# Patient Record
Sex: Female | Born: 1960 | Race: White | Hispanic: No | Marital: Married | Smoking: Never smoker
Health system: Southern US, Community
[De-identification: ages and names within clinical notes are randomized; demographics above are authoritative.]

## PROBLEM LIST (undated history)

## (undated) DIAGNOSIS — M810 Age-related osteoporosis without current pathological fracture: Secondary | ICD-10-CM

## (undated) DIAGNOSIS — T83718A Erosion of other implanted mesh and other prosthetic materials to surrounding organ or tissue, initial encounter: Secondary | ICD-10-CM

## (undated) DIAGNOSIS — Z8719 Personal history of other diseases of the digestive system: Secondary | ICD-10-CM

## (undated) DIAGNOSIS — G40109 Localization-related (focal) (partial) symptomatic epilepsy and epileptic syndromes with simple partial seizures, not intractable, without status epilepticus: Secondary | ICD-10-CM

## (undated) HISTORY — DX: Erosion of other implanted mesh to organ or tissue, initial encounter: T83.718A

## (undated) HISTORY — DX: Personal history of other diseases of the digestive system: Z87.19

## (undated) HISTORY — PX: COLONOSCOPY: SHX174

## (undated) HISTORY — DX: Age-related osteoporosis without current pathological fracture: M81.0

## (undated) HISTORY — PX: DENTAL SURGERY: SHX609

## (undated) HISTORY — DX: Localization-related (focal) (partial) symptomatic epilepsy and epileptic syndromes with simple partial seizures, not intractable, without status epilepticus: G40.109

## (undated) HISTORY — PX: APPENDECTOMY: SHX54

---

## 2003-12-17 HISTORY — PX: PARTIAL HYSTERECTOMY: SHX80

## 2014-07-07 HISTORY — PX: OTHER SURGICAL HISTORY: SHX169

## 2017-09-19 DIAGNOSIS — N8110 Cystocele, unspecified: Secondary | ICD-10-CM | POA: Insufficient documentation

## 2017-10-31 DIAGNOSIS — G40909 Epilepsy, unspecified, not intractable, without status epilepticus: Secondary | ICD-10-CM | POA: Insufficient documentation

## 2018-05-21 DIAGNOSIS — I6521 Occlusion and stenosis of right carotid artery: Secondary | ICD-10-CM | POA: Insufficient documentation

## 2019-10-12 DIAGNOSIS — E559 Vitamin D deficiency, unspecified: Secondary | ICD-10-CM | POA: Insufficient documentation

## 2021-05-08 ENCOUNTER — Other Ambulatory Visit: Payer: Self-pay

## 2021-05-08 ENCOUNTER — Encounter: Payer: Self-pay | Admitting: Neurology

## 2021-05-08 ENCOUNTER — Ambulatory Visit: Payer: 59 | Admitting: Neurology

## 2021-05-08 VITALS — BP 118/84 | HR 74 | Ht 66.0 in | Wt 156.8 lb

## 2021-05-08 DIAGNOSIS — G40209 Localization-related (focal) (partial) symptomatic epilepsy and epileptic syndromes with complex partial seizures, not intractable, without status epilepticus: Secondary | ICD-10-CM | POA: Diagnosis not present

## 2021-05-08 MED ORDER — ZONEGRAN 100 MG PO CAPS: ORAL_CAPSULE | ORAL | 11 refills | Status: DC

## 2021-05-08 MED ORDER — VIMPAT 200 MG PO TABS: 200.0000 mg | ORAL_TABLET | Freq: Two times a day (BID) | ORAL | 5 refills | Status: DC

## 2021-05-08 NOTE — Patient Instructions (Addendum)
1. Schedule MRI brain with and without contrast  2. We will switch to brand Zonegran 100mg : Take 2 capsules twice a day and brand Vimpat 200mg : 1 tablet twice a day  3. Wean off clobazam 1/2 every other day for a week then stop  4. Follow-up in 3 months, call for any changes   Seizure Precautions: 1. If medication has been prescribed for you to prevent seizures, take it exactly as directed.  Do not stop taking the medicine without talking to your doctor first, even if you have not had a seizure in a long time.   2. Avoid activities in which a seizure would cause danger to yourself or to others.  Don't operate dangerous machinery, swim alone, or climb in high or dangerous places, such as on ladders, roofs, or girders.  Do not drive unless your doctor says you may.  3. If you have any warning that you may have a seizure, lay down in a safe place where you can't hurt yourself.    4.  No driving for 6 months from last seizure, as per [facility].   Please refer to the following link on the Epilepsy Foundation of America's website for more information: http://www.epilepsyfoundation.org/answerplace/Social/driving/drivingu.cfm   5.  Maintain good sleep hygiene. Avoid alcohol  6.  Contact your doctor if you have any problems that may be related to the medicine you are taking.  7.  Call 911 and bring the patient back to the ED if:        A.  The seizure lasts longer than 5 minutes.       B.  The patient doesn't awaken shortly after the seizure  C.  The patient has new problems such as difficulty seeing, speaking or moving  D.  The patient was injured during the seizure  E.  The patient has a temperature over 102 F (39C)  F.  The patient vomited and now is having trouble breathing
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
## 2021-05-09 NOTE — Progress Notes (Unsigned)
Kaprice Kage (Key: [id number]) Rx #: [id number] Vimpat 200MG  tablets   Form [facility] Electronic Prior Authorization Form (2017 NCPDP) Created 20 hours ago Sent to Plan 16 minutes ago Plan Response 16 minutes ago Submit Clinical Questions less than a minute ago Determination Wait for Determination Please wait for [facility] 2017 NCPDP to return a determination.

## 2021-05-11 ENCOUNTER — Telehealth: Payer: Self-pay | Admitting: Neurology

## 2021-05-11 NOTE — Telephone Encounter (Signed)
We will start a PA in cover my meds, will call a pt to let her know

## 2021-05-11 NOTE — Telephone Encounter (Signed)
Patient called and said she is having problems with her insurance covering her Vimpat and Zonegran. She said they both require prior authorization.

## 2021-05-15 NOTE — Progress Notes (Signed)
This request has been approved. °

## 2021-05-15 NOTE — Telephone Encounter (Signed)
This was started awaiting answer,

## 2021-05-16 ENCOUNTER — Telehealth: Payer: Self-pay

## 2021-05-16 NOTE — Telephone Encounter (Signed)
Pt called an informed that samples of vimpat 100mg  are at the front for her to pick up and that she will need to take 2 tablets BID to equal 200mg   BID, pt verbalized understanding,

## 2021-05-16 NOTE — Telephone Encounter (Signed)
Medication Samples have been provided to the patient.  Drug name: vimpat     Strength: 100 mg        Qty: 4 box  LOT: [id number]  Exp.Date: 01/2025  Dosing instructions: take 2 tablets BID  The patient has been instructed regarding the correct time, dose, and frequency of taking this medication, including desired effects and most common side effects.   11:35 AM 05/16/2021

## 2021-05-21 ENCOUNTER — Encounter: Payer: Self-pay | Admitting: Neurology

## 2021-05-28 ENCOUNTER — Telehealth: Payer: Self-pay

## 2021-05-28 NOTE — Telephone Encounter (Signed)
Medication Samples have been provided to the patient.  Drug name: vimpat       Strength: 150 mg        Qty: 4  LOT: [id number]  Exp.Date: 09/2023  Dosing instructions: take 150mg  with 50mg  BID  Drug name: vimpat       Strength: 50 mg        Qty: 4  LOT:  Exp.Date: 12/2024  Dosing instructions: take 50mg  with 150mg  BID  The patient has been instructed regarding the correct time, dose, and frequency of taking this medication, including desired effects and most common side effects.

## 2021-05-31 ENCOUNTER — Other Ambulatory Visit: Payer: 59

## 2021-06-01 ENCOUNTER — Other Ambulatory Visit: Payer: Self-pay

## 2021-06-01 MED ORDER — VIMPAT 200 MG PO TABS: 200.0000 mg | ORAL_TABLET | Freq: Two times a day (BID) | ORAL | 5 refills | Status: DC

## 2021-06-12 ENCOUNTER — Ambulatory Visit
Admission: RE | Admit: 2021-06-12 | Discharge: 2021-06-12 | Disposition: A | Discharge status: Home / Self Care | Payer: 59 | Source: Ambulatory Visit | Attending: Neurology | Admitting: Neurology

## 2021-06-12 ENCOUNTER — Other Ambulatory Visit: Payer: Self-pay

## 2021-06-12 DIAGNOSIS — G40209 Localization-related (focal) (partial) symptomatic epilepsy and epileptic syndromes with complex partial seizures, not intractable, without status epilepticus: Secondary | ICD-10-CM

## 2021-06-12 MED ORDER — GADOBENATE DIMEGLUMINE 529 MG/ML IV SOLN
14.0000 mL | Freq: Once | INTRAVENOUS | Status: AC | PRN
  Administered 2021-06-12: 14 mL via INTRAVENOUS

## 2021-06-14 ENCOUNTER — Telehealth: Payer: Self-pay

## 2021-06-14 DIAGNOSIS — G40209 Localization-related (focal) (partial) symptomatic epilepsy and epileptic syndromes with complex partial seizures, not intractable, without status epilepticus: Secondary | ICD-10-CM

## 2021-06-14 NOTE — Telephone Encounter (Signed)
Pt called and informed that brain MRI was read as normal, no evidence of tumor, stroke, bleed, or scartissue. I would like to repeat her EEG so we have a baseline here. Order placed in Epic

## 2021-06-14 NOTE — Telephone Encounter (Signed)
-----   Message from Van Clines, MD sent at 06/14/2021  9:46 AM EDT ----- Pls let Ms Mentel know that the brain MRI was read as normal, no evidence of tumor, stroke, bleed, or scar tissue. I would like to repeat her EEG so we have a baseline here, pls order 1-hour EEG, thanks!

## 2021-06-26 ENCOUNTER — Other Ambulatory Visit: Payer: Self-pay

## 2021-06-26 MED ORDER — VIMPAT 200 MG PO TABS: 200.0000 mg | ORAL_TABLET | Freq: Two times a day (BID) | ORAL | 5 refills | Status: DC

## 2021-06-27 ENCOUNTER — Ambulatory Visit: Payer: 59 | Admitting: Neurology

## 2021-06-27 ENCOUNTER — Other Ambulatory Visit: Payer: Self-pay

## 2021-06-27 DIAGNOSIS — G40209 Localization-related (focal) (partial) symptomatic epilepsy and epileptic syndromes with complex partial seizures, not intractable, without status epilepticus: Secondary | ICD-10-CM | POA: Diagnosis not present

## 2021-07-02 NOTE — Progress Notes (Unsigned)
preferred on your patient's plan. Macky Lower (Key: [id number]) Vimpat 200MG  tablets   Form [facility] Electronic Prior Authorization Form (2017 NCPDP) Created 6 days ago Sent to Plan 5 minutes ago Plan Response 5 minutes ago Submit Clinical Questions less than a minute ago Determination Wait for Determination Please wait for [facility] 2017 NCPDP to return a determination.

## 2021-07-05 NOTE — Procedures (Signed)
ELECTROENCEPHALOGRAM REPORT  Date of Study: 06/27/2021  Patient's Name: Deborah Lynn MRN: [id number] Date of Birth: 01/20/1961  Referring Provider: Dr. Patrcia Dolly  Clinical History:  This is a 60 year old woman with left temporal lobe epilepsy s/p VNS placement with continued seizures on 3 ASMs.   Medications: ONFI 10 MG tablet VIMPAT 200 MG TABS tablet ZONEGRAN 100 MG capsule  Technical Summary: A multichannel digital 1-hour EEG recording measured by the international 10-20 system with electrodes applied with paste and impedances below 5000 ohms performed in our laboratory with EKG monitoring in an awake and asleep patient.  Hyperventilation was not performed. Photic stimulation was performed.  The digital EEG was referentially recorded, reformatted, and digitally filtered in a variety of bipolar and referential montages for optimal display.    Description: The patient is awake and asleep during the recording.  During maximal wakefulness, there is a symmetric, medium voltage 9.5-10 Hz posterior dominant rhythm that attenuates with eye opening.  There is frequent focal theta and delta slowing over the left temporal region. During drowsiness and sleep, there is an increase in theta slowing of the background.  Vertex waves and symmetric sleep spindles were seen. Photic stimulation did not elicit any abnormalities.  There were occasional sharp waves over the left frontotemporal region. No electrographic seizures seen.    EKG lead was unremarkable.  Impression: This awake and asleep EEG is abnormal due to the presence of: Focal slowing over the left temporal region Occasional epileptiform discharges over the left frontotemporal region  Clinical Correlation of the above findings indicates focal cerebral dysfunction over the left temporal region suggestive of underlying structural or physiologic abnormality. There is a tendency for seizures to arise from the left frontotemporal region.  Clinical correlation is advised.   Patrcia Dolly, M.D.

## 2021-08-03 ENCOUNTER — Other Ambulatory Visit: Payer: Self-pay

## 2021-08-03 MED ORDER — ZONEGRAN 100 MG PO CAPS: ORAL_CAPSULE | ORAL | 0 refills | Status: DC

## 2021-08-06 ENCOUNTER — Ambulatory Visit: Payer: Self-pay | Admitting: Neurology

## 2021-08-06 ENCOUNTER — Telehealth: Payer: Self-pay

## 2021-08-06 ENCOUNTER — Other Ambulatory Visit: Payer: Self-pay | Admitting: Neurology

## 2021-08-06 MED ORDER — ZONISAMIDE 100 MG PO CAPS: ORAL_CAPSULE | ORAL | 1 refills | Status: DC

## 2021-08-06 NOTE — Telephone Encounter (Signed)
New message    Pending Key: [id number] - PA Case ID: [id number] - Rx #: [id number]   Need help? Call us at [phone]  Status Sent to Plan today  Drug Zonegran 100MG  capsules  Form [facility] Electronic Prior Authorization Form (2017 NCPDP)

## 2021-08-07 NOTE — Telephone Encounter (Signed)
F/u   Approved on 08/06/2021 through 08/06/2022   Key: [id number] - PA Case ID: [id number] - Rx #: [id number]  Request Reference Number: [id number]. ZONEGRAN CAP 100MG  is approved through 08/06/2022. Your patient may now fill this prescription and it will be covered.

## 2021-09-06 ENCOUNTER — Other Ambulatory Visit: Payer: Self-pay

## 2021-09-06 ENCOUNTER — Encounter: Payer: Self-pay | Admitting: Neurology

## 2021-09-06 ENCOUNTER — Ambulatory Visit: Payer: 59 | Admitting: Neurology

## 2021-09-06 VITALS — BP 104/72 | HR 73 | Ht 66.0 in | Wt 160.0 lb

## 2021-09-06 DIAGNOSIS — G40209 Localization-related (focal) (partial) symptomatic epilepsy and epileptic syndromes with complex partial seizures, not intractable, without status epilepticus: Secondary | ICD-10-CM

## 2021-09-06 NOTE — Patient Instructions (Signed)
Good to see you.  Continue Vimpat 200mg  twice a day.  2. Once close to a week for your Zonegran refill, send me a message so I can send in the prescription for Zonegran 200mg  twice a day  3. Keep seizure diary  4. Follow-up in 4 months, call for any changes   Seizure Precautions: 1. If medication has been prescribed for you to prevent seizures, take it exactly as directed.  Do not stop taking the medicine without talking to your doctor first, even if you have not had a seizure in a long time.   2. Avoid activities in which a seizure would cause danger to yourself or to others.  Don't operate dangerous machinery, swim alone, or climb in high or dangerous places, such as on ladders, roofs, or girders.  Do not drive unless your doctor says you may.  3. If you have any warning that you may have a seizure, lay down in a safe place where you can't hurt yourself.    4.  No driving for 6 months from last seizure, as per [facility].   Please refer to the following link on the Epilepsy Foundation of America's website for more information: http://www.epilepsyfoundation.org/answerplace/Social/driving/drivingu.cfm   5.  Maintain good sleep hygiene. Avoid alcohol.  6.  Contact your doctor if you have any problems that may be related to the medicine you are taking.  7.  Call 911 and bring the patient back to the ED if:        A.  The seizure lasts longer than 5 minutes.       B.  The patient doesn't awaken shortly after the seizure  C.  The patient has new problems such as difficulty seeing, speaking or moving  D.  The patient was injured during the seizure  E.  The patient has a temperature over 102 F (39C)  F.  The patient vomited and now is having trouble breathing
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
## 2021-09-28 ENCOUNTER — Telehealth: Payer: Self-pay

## 2021-09-28 NOTE — Telephone Encounter (Signed)
New message  [name] Key: [id number] - [place] Case ID: [id number] Need help? Call us at [phone]  Outcome N/Atoday  This case has been cancelled by the provider because the prescriber will initiate a new request for Generic equivalent.  Drug Zonegran 100MG  capsules Form [employer] Electronic Prior Authorization Form (2017 NCPDP)    This request has received a N/A outcome.  Please note any additional information provided by [facility] at the bottom of this request.

## 2021-10-02 NOTE — Telephone Encounter (Signed)
F/u   Renewal submit via CoverMyMeds  Deborah Lynn Key: [id number] - [place] Case ID: [id number] Need help? Call us at [phone] Status Sent to Plan today Drug Zonegran 100MG  capsules Form [employer] Electronic Prior Authorization Form (2017 NCPDP)

## 2021-10-08 NOTE — Telephone Encounter (Signed)
F/u   Received fax from [facility]    This medication or product was previously approved on [place] -[id number] from 08/06/2021  to 08/06/2022

## 2021-10-15 ENCOUNTER — Telehealth: Payer: Self-pay | Admitting: Neurology

## 2021-10-15 NOTE — Telephone Encounter (Signed)
Marylene Land from [facility] called in to see if we have received a letter from them with some questions. It had been faxed in last week. They received a report about the patient's VNS and they need those questions answered and faxed back to them.

## 2021-10-23 NOTE — Telephone Encounter (Signed)
Done, thanks

## 2021-10-31 ENCOUNTER — Other Ambulatory Visit: Payer: Self-pay

## 2021-11-01 MED ORDER — ZONISAMIDE 100 MG PO CAPS: ORAL_CAPSULE | ORAL | 3 refills | Status: DC

## 2021-11-29 ENCOUNTER — Other Ambulatory Visit: Payer: Self-pay | Admitting: Neurology

## 2021-12-04 ENCOUNTER — Other Ambulatory Visit: Payer: Self-pay | Admitting: Neurology

## 2021-12-04 MED ORDER — LACOSAMIDE 200 MG PO TABS: ORAL_TABLET | ORAL | 5 refills | Status: DC

## 2021-12-06 ENCOUNTER — Encounter: Payer: Self-pay | Admitting: Neurology

## 2021-12-07 ENCOUNTER — Encounter: Payer: Self-pay | Admitting: Neurology

## 2021-12-07 ENCOUNTER — Telehealth: Payer: 59 | Admitting: Neurology

## 2021-12-07 ENCOUNTER — Other Ambulatory Visit: Payer: Self-pay

## 2021-12-07 VITALS — Ht 66.0 in | Wt 160.0 lb

## 2021-12-07 DIAGNOSIS — G40209 Localization-related (focal) (partial) symptomatic epilepsy and epileptic syndromes with complex partial seizures, not intractable, without status epilepticus: Secondary | ICD-10-CM | POA: Diagnosis not present

## 2021-12-07 MED ORDER — ZONISAMIDE 100 MG PO CAPS: ORAL_CAPSULE | ORAL | 3 refills | Status: DC

## 2021-12-07 MED ORDER — FLUOXETINE HCL 10 MG PO CAPS: 10.0000 mg | ORAL_CAPSULE | Freq: Every day | ORAL | 6 refills | Status: DC

## 2021-12-07 MED ORDER — LACOSAMIDE 200 MG PO TABS: ORAL_TABLET | ORAL | 5 refills | Status: DC
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
## 2021-12-24 ENCOUNTER — Other Ambulatory Visit: Payer: Self-pay | Admitting: Neurology

## 2022-01-16 ENCOUNTER — Encounter: Payer: Self-pay | Admitting: Neurology

## 2022-01-18 ENCOUNTER — Ambulatory Visit: Payer: 59 | Admitting: Neurology

## 2022-03-08 ENCOUNTER — Ambulatory Visit: Payer: 59 | Admitting: Neurology

## 2022-03-08 ENCOUNTER — Encounter: Payer: Self-pay | Admitting: Neurology

## 2022-03-08 ENCOUNTER — Other Ambulatory Visit: Payer: Self-pay

## 2022-03-08 VITALS — BP 117/75 | HR 71 | Resp 18 | Ht 66.0 in | Wt 164.0 lb

## 2022-03-08 DIAGNOSIS — G40209 Localization-related (focal) (partial) symptomatic epilepsy and epileptic syndromes with complex partial seizures, not intractable, without status epilepticus: Secondary | ICD-10-CM

## 2022-03-08 MED ORDER — VIMPAT 200 MG PO TABS: 200.0000 mg | ORAL_TABLET | Freq: Two times a day (BID) | ORAL | 5 refills | Status: DC

## 2022-03-08 MED ORDER — ZONISAMIDE 100 MG PO CAPS: ORAL_CAPSULE | ORAL | 3 refills | Status: DC

## 2022-03-08 MED ORDER — LORAZEPAM 0.5 MG PO TABS: ORAL_TABLET | ORAL | 5 refills | Status: DC
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
## 2022-03-11 ENCOUNTER — Telehealth: Payer: Self-pay | Admitting: Neurology

## 2022-03-11 ENCOUNTER — Encounter: Payer: Self-pay | Admitting: Neurology

## 2022-03-11 NOTE — Telephone Encounter (Signed)
Pls let them know can take up to 2 tablets in one day, do not take more than 3 a week. Thanks ?

## 2022-03-11 NOTE — Telephone Encounter (Signed)
Deborah Lynn with [facility] called, they needs clarification on directions for her lorazepam.  ?[phone]   ?

## 2022-03-12 NOTE — Telephone Encounter (Signed)
Spoke with [facility] gave clarification pt  can take up to 2 tablets in one day, do not take more than 3 a week. ?

## 2022-04-16 ENCOUNTER — Encounter: Payer: Self-pay | Admitting: Neurology

## 2022-05-27 ENCOUNTER — Emergency Department (HOSPITAL_BASED_OUTPATIENT_CLINIC_OR_DEPARTMENT_OTHER): Payer: 59 | Admitting: Radiology

## 2022-05-27 ENCOUNTER — Encounter (HOSPITAL_BASED_OUTPATIENT_CLINIC_OR_DEPARTMENT_OTHER): Payer: Self-pay | Admitting: *Deleted

## 2022-05-27 ENCOUNTER — Emergency Department (HOSPITAL_BASED_OUTPATIENT_CLINIC_OR_DEPARTMENT_OTHER)
Admission: EM | Admit: 2022-05-27 | Discharge: 2022-05-27 | Disposition: A | Discharge status: Home / Self Care | Payer: 59 | Attending: Emergency Medicine | Admitting: Emergency Medicine

## 2022-05-27 ENCOUNTER — Other Ambulatory Visit: Payer: Self-pay

## 2022-05-27 DIAGNOSIS — S6992XA Unspecified injury of left wrist, hand and finger(s), initial encounter: Secondary | ICD-10-CM | POA: Diagnosis present

## 2022-05-27 DIAGNOSIS — S61211A Laceration without foreign body of left index finger without damage to nail, initial encounter: Secondary | ICD-10-CM | POA: Diagnosis not present

## 2022-05-27 DIAGNOSIS — W260XXA Contact with knife, initial encounter: Secondary | ICD-10-CM | POA: Insufficient documentation

## 2022-05-27 DIAGNOSIS — Z91040 Latex allergy status: Secondary | ICD-10-CM | POA: Insufficient documentation

## 2022-05-27 MED ORDER — BUPIVACAINE HCL (PF) 0.5 % IJ SOLN
10.0000 mL | Freq: Once | INTRAMUSCULAR | Status: AC
  Administered 2022-05-27: 10 mL
  Filled 2022-05-27: qty 10

## 2022-05-27 NOTE — ED Triage Notes (Signed)
Pt cut her left index finger with a 8 inch chef knife which was clean.  Bleeding controlled, last tetanus in the last 5-10 years.

## 2022-05-27 NOTE — ED Provider Notes (Signed)
[facility] EMERGENCY DEPT Provider Note   CSN: [id number] Arrival date & time: 05/27/22  1956     History  Chief Complaint  Patient presents with   Laceration    Deborah Lynn is a 61 y.o. female.  Pt is a 61 yo female with a pmhx significant for epilepsy.  Pt said she was using a knife to make dinner and sustained a lac to her left index finger.  Pt denies any other injury.       Home Medications Prior to Admission medications   Medication Sig Start Date End Date Taking? Authorizing Provider  LORazepam (ATIVAN) 0.5 MG tablet Take 1 tablet as needed for seizure clusters 03/08/22   Van Clines, MD  VIMPAT 200 MG TABS tablet Take 1 tablet (200 mg total) by mouth 2 (two) times daily. 03/08/22   Van Clines, MD  zonisamide (ZONEGRAN) 100 MG capsule TAKE 2 CAPSULES BY MOUTH TWICE DAILY 03/08/22   Van Clines, MD      Allergies    Lamotrigine and Latex    Review of Systems   Review of Systems  Skin:  Positive for wound.    Physical Exam Updated Vital Signs BP 125/77 (BP Location: Right Arm)   Pulse (!) 58   Temp 97.8 F (36.6 C) (Oral)   Resp 16   Wt 77.1 kg   SpO2 100%   BMI 27.44 kg/m  Physical Exam Vitals and nursing note reviewed.  Constitutional:      Appearance: Normal appearance.  HENT:     Head: Normocephalic and atraumatic.     Right Ear: External ear normal.     Left Ear: External ear normal.     Nose: Nose normal.     Mouth/Throat:     Mouth: Mucous membranes are moist.     Pharynx: Oropharynx is clear.  Eyes:     Extraocular Movements: Extraocular movements intact.     Conjunctiva/sclera: Conjunctivae normal.     Pupils: Pupils are equal, round, and reactive to light.  Cardiovascular:     Rate and Rhythm: Normal rate and regular rhythm.     Pulses: Normal pulses.     Heart sounds: Normal heart sounds.  Pulmonary:     Effort: Pulmonary effort is normal.     Breath sounds: Normal breath sounds.  Abdominal:      General: Abdomen is flat. Bowel sounds are normal.     Palpations: Abdomen is soft.  Musculoskeletal:     Cervical back: Normal range of motion and neck supple.     Comments: Lac to left index finger  Skin:    General: Skin is warm.     Capillary Refill: Capillary refill takes less than 2 seconds.  Neurological:     General: No focal deficit present.     Mental Status: She is alert and oriented to person, place, and time.  Psychiatric:        Mood and Affect: Mood normal.        Behavior: Behavior normal.     ED Results / Procedures / Treatments   Labs (all labs ordered are listed, but only abnormal results are displayed) Labs Reviewed - No data to display  EKG None  Radiology DG Finger Index Left  Result Date: 05/27/2022 CLINICAL DATA:  Left index finger laceration and pain. EXAM: LEFT INDEX FINGER 2+V COMPARISON:  None Available. FINDINGS: There is no evidence of fracture or dislocation. There is no evidence of arthropathy or other  focal bone abnormality. Soft tissues are unremarkable. IMPRESSION: Negative. Electronically Signed   By: Elgie Collard M.D.   On: 05/27/2022 22:05    Procedures .Marland KitchenLaceration Repair  Date/Time: 05/27/2022 10:27 PM  Performed by: Jacalyn Lefevre, MD Authorized by: Jacalyn Lefevre, MD   Consent:    Consent obtained:  Verbal   Consent given by:  Patient Universal protocol:    Patient identity confirmed:  Verbally with patient Anesthesia:    Anesthesia method:  Nerve block   Block location:  Digital   Block needle gauge:  27 G   Block anesthetic:  Bupivacaine 0.5% w/o epi   Block technique:  Digital Laceration details:    Location:  Finger   Finger location:  L index finger   Length (cm):  1 Pre-procedure details:    Preparation:  Patient was prepped and draped in usual sterile fashion Treatment:    Area cleansed with:  Povidone-iodine   Irrigation solution:  Sterile saline Skin repair:    Repair method:  Sutures   Suture size:   4-0   Suture material:  Prolene   Suture technique:  Simple interrupted   Number of sutures:  3 Approximation:    Approximation:  Close Repair type:    Repair type:  Simple Post-procedure details:    Dressing:  Antibiotic ointment   Procedure completion:  Tolerated well, no immediate complications     Medications Ordered in ED Medications  bupivacaine(PF) (MARCAINE) 0.5 % injection 10 mL (10 mLs Infiltration Given by Other 05/27/22 2156)    ED Course/ Medical Decision Making/ A&P                           Medical Decision Making Amount and/or Complexity of Data Reviewed Radiology: ordered.  Risk Prescription drug management.   Pt's wound sutured.  Pt is stable for d/c.  Return if worse.         Final Clinical Impression(s) / ED Diagnoses Final diagnoses:  Laceration of left index finger without foreign body without damage to nail, initial encounter    Rx / DC Orders ED Discharge Orders     None         Jacalyn Lefevre, MD 05/27/22 2312

## 2022-05-27 NOTE — ED Notes (Signed)
Dressing applied to left finger.

## 2022-06-03 ENCOUNTER — Ambulatory Visit: Payer: Self-pay

## 2022-06-03 ENCOUNTER — Telehealth: Payer: Self-pay | Admitting: Family

## 2022-06-03 NOTE — Telephone Encounter (Signed)
error 

## 2022-06-03 NOTE — Telephone Encounter (Signed)
  Chief Complaint: suture removal  Symptoms: sutures to L index finger Frequency:  Pertinent Negatives: NA Disposition: [] ED /[] Urgent Care (no appt availability in office) / [] Appointment(In office/virtual)/ []  [place] [facility]/ [x] Home Care/ [] Refused Recommended Disposition /[] [facility]/ []  Follow-up with PCP Additional Notes: pt called, advised her per to [facility] to fu at [facility] ED for suture removal. Pt had scheduled NPA at [facility] and wanted to cancel that. I advised her I was unable to cancel that appt but she could try cancelling from mychart. Pt wants to ask her Neuro provider for recommendation for PCP. No further assistance needed.    Summary: Sutring advice   Pt is calling because she had Laceration of left index finger on 05/27/22 Pt would like to know how long does she need to keep the suturing in her finger? Please advise     Reason for Disposition  Health Information question, no triage required and triager able to answer question  Answer Assessment - Initial Assessment Questions 1. REASON FOR CALL or QUESTION: "What is your reason for calling today?" or "How can I best help you?" or "What question do you have that I can help answer?"     When to get sutures removed.  Protocols used: Information Only Call - No Triage-A-AH

## 2022-06-06 ENCOUNTER — Emergency Department (HOSPITAL_BASED_OUTPATIENT_CLINIC_OR_DEPARTMENT_OTHER)
Admission: EM | Admit: 2022-06-06 | Discharge: 2022-06-06 | Disposition: A | Discharge status: Home / Self Care | Payer: 59 | Attending: Emergency Medicine | Admitting: Emergency Medicine

## 2022-06-06 DIAGNOSIS — Z91040 Latex allergy status: Secondary | ICD-10-CM | POA: Diagnosis not present

## 2022-06-06 DIAGNOSIS — Z4802 Encounter for removal of sutures: Secondary | ICD-10-CM | POA: Diagnosis present

## 2022-06-06 NOTE — ED Triage Notes (Signed)
Patient presents for suture removal.

## 2022-06-06 NOTE — ED Provider Notes (Signed)
[facility] EMERGENCY DEPT Provider Note   CSN: [id number] Arrival date & time: 06/06/22  1442     History  Chief Complaint  Patient presents with   Suture / Staple Removal    Deborah Lynn is a 61 y.o. female.  Patient presents for suture removal from left index finger.  Sutures have been in for the past 10 days.  She reports no issues with the wound.  No bleeding or drainage.  No fever, chills, nausea or vomiting.  No weakness, numbness or tingling  The history is provided by the patient.  Suture / Staple Removal       Home Medications Prior to Admission medications   Medication Sig Start Date End Date Taking? Authorizing Provider  LORazepam (ATIVAN) 0.5 MG tablet Take 1 tablet as needed for seizure clusters 03/08/22   Van Clines, MD  VIMPAT 200 MG TABS tablet Take 1 tablet (200 mg total) by mouth 2 (two) times daily. 03/08/22   Van Clines, MD  zonisamide (ZONEGRAN) 100 MG capsule TAKE 2 CAPSULES BY MOUTH TWICE DAILY 03/08/22   Van Clines, MD      Allergies    Lamotrigine and Latex    Review of Systems   Review of Systems  Skin:  Positive for wound.   all other systems are negative except as noted in the HPI and PMH.    Physical Exam Updated Vital Signs BP 133/84 (BP Location: Right Arm)   Pulse 61   Temp 97.8 F (36.6 C) (Oral)   Resp 15   SpO2 100%  Physical Exam Vitals and nursing note reviewed.  Constitutional:      General: She is not in acute distress.    Appearance: She is well-developed.  HENT:     Head: Normocephalic and atraumatic.     Mouth/Throat:     Pharynx: No oropharyngeal exudate.  Eyes:     Conjunctiva/sclera: Conjunctivae normal.     Pupils: Pupils are equal, round, and reactive to light.  Neck:     Comments: No meningismus. Cardiovascular:     Rate and Rhythm: Normal rate and regular rhythm.     Heart sounds: Normal heart sounds. No murmur heard. Pulmonary:     Effort: Pulmonary effort is normal. No  respiratory distress.     Breath sounds: Normal breath sounds.  Abdominal:     Palpations: Abdomen is soft.     Tenderness: There is no abdominal tenderness. There is no guarding or rebound.  Musculoskeletal:        General: No tenderness. Normal range of motion.     Cervical back: Normal range of motion and neck supple.     Comments: 3 sutures intact to left index finger.  No surrounding erythema or drainage.  Skin:    General: Skin is warm.  Neurological:     Mental Status: She is alert and oriented to person, place, and time.     Cranial Nerves: No cranial nerve deficit.     Motor: No abnormal muscle tone.     Coordination: Coordination normal.     Comments:  5/5 strength throughout. CN 2-12 intact.Equal grip strength.   Psychiatric:        Behavior: Behavior normal.     ED Results / Procedures / Treatments   Labs (all labs ordered are listed, but only abnormal results are displayed) Labs Reviewed - No data to display  EKG None  Radiology No results found.  Procedures .Suture Removal  Date/Time:  06/06/2022 3:11 PM  Performed by: Glynn Octave, MD Authorized by: Glynn Octave, MD   Consent:    Consent obtained:  Verbal   Consent given by:  Patient   Risks, benefits, and alternatives were discussed: yes     Risks discussed:  Bleeding, pain and wound separation   Alternatives discussed:  No treatment Universal protocol:    Procedure explained and questions answered to patient or proxy's satisfaction: yes     Patient identity confirmed:  Verbally with patient Location:    Location:  Upper extremity   Upper extremity location:  Hand   Hand location:  L index finger Procedure details:    Wound appearance:  No signs of infection   Number of sutures removed:  3 Post-procedure details:    Post-removal:  Band-Aid applied   Procedure completion:  Tolerated well, no immediate complications     Medications Ordered in ED Medications - No data to display  ED  Course/ Medical Decision Making/ A&P                           Medical Decision Making Patient presents for suture removal.  No complications from same.  Sutures removed without difficulty.  Instructed on local wound care and PCP follow-up.  Return precautions discussed        Final Clinical Impression(s) / ED Diagnoses Final diagnoses:  Visit for suture removal    Rx / DC Orders ED Discharge Orders     None         Bhavya Grand, Jeannett Senior, MD 06/06/22 1546

## 2022-06-10 ENCOUNTER — Ambulatory Visit: Payer: 59 | Admitting: Family

## 2022-06-14 ENCOUNTER — Ambulatory Visit: Payer: 59 | Admitting: Neurology

## 2022-07-08 ENCOUNTER — Other Ambulatory Visit (HOSPITAL_COMMUNITY): Payer: Self-pay

## 2022-07-08 ENCOUNTER — Ambulatory Visit: Payer: 59 | Admitting: Neurology

## 2022-07-08 ENCOUNTER — Encounter: Payer: Self-pay | Admitting: Neurology

## 2022-07-08 VITALS — BP 133/84 | HR 62 | Ht 66.0 in | Wt 164.8 lb

## 2022-07-08 DIAGNOSIS — G40209 Localization-related (focal) (partial) symptomatic epilepsy and epileptic syndromes with complex partial seizures, not intractable, without status epilepticus: Secondary | ICD-10-CM | POA: Diagnosis not present

## 2022-07-08 MED ORDER — LORAZEPAM 0.5 MG PO TABS
ORAL_TABLET | ORAL | 5 refills | Status: DC
Start: 2022-07-08 — End: 2023-01-28

## 2022-07-08 MED ORDER — VIMPAT 200 MG PO TABS: 200.0000 mg | ORAL_TABLET | Freq: Two times a day (BID) | ORAL | 5 refills | Status: DC

## 2022-07-08 MED ORDER — ZONISAMIDE 100 MG PO CAPS
ORAL_CAPSULE | ORAL | 3 refills | Status: DC
Start: 2022-07-08 — End: 2023-04-17
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
## 2022-08-21 ENCOUNTER — Telehealth: Payer: Self-pay | Admitting: Neurology

## 2022-08-21 NOTE — Telephone Encounter (Signed)
Pls let her know that I am out of the office that week. But even if I am not here, she can put a tape the magnet over the VNS device and it will turn the device off. VNS labeling actually does not support that having the device turned off protects or makes it any more safer for her generator than leaving it turned on, but if her doctor prefers it off, she can tape magnet over the device. What is the name of her doctor? Thanks

## 2022-08-21 NOTE — Telephone Encounter (Signed)
Patient has an appt 09/06/22 9:15am for a colonoscopy and endoscopy. She stated the doctor is nervous about the nerve stimulator. Christy would like to know if she is able to come before and after the procedure to get it turned off/on.  She doesn't want to inconvenience dr.aquino. would it be okay to put the magnet on and pause it. She is nervous about the procedure.

## 2022-08-22 NOTE — Telephone Encounter (Signed)
Pls let her know that I am out of the office that week. But even if I am not here, she can put a tape the magnet over the VNS device and it will turn the device off. VNS labeling actually does not support that having the device turned off protects or makes it any more safer for her generator than leaving it turned on, but if her doctor prefers it off, she can tape magnet over the device. What is the name of her doctor? Thanks   

## 2022-08-29 NOTE — Telephone Encounter (Signed)
Called Pt and she informed me that the gastroenterologist Dr. Is going to leave it on during procedure.

## 2022-10-22 ENCOUNTER — Encounter: Payer: Self-pay | Admitting: Neurology

## 2022-10-22 ENCOUNTER — Ambulatory Visit: Payer: 59 | Admitting: Neurology

## 2022-10-22 VITALS — BP 118/82 | HR 62 | Ht 66.0 in | Wt 163.4 lb

## 2022-10-22 DIAGNOSIS — R200 Anesthesia of skin: Secondary | ICD-10-CM

## 2022-10-22 DIAGNOSIS — G40209 Localization-related (focal) (partial) symptomatic epilepsy and epileptic syndromes with complex partial seizures, not intractable, without status epilepticus: Secondary | ICD-10-CM | POA: Diagnosis not present

## 2022-10-22 MED ORDER — VIMPAT 200 MG PO TABS: 200.0000 mg | ORAL_TABLET | Freq: Two times a day (BID) | ORAL | 5 refills | Status: DC

## 2022-10-22 NOTE — Patient Instructions (Signed)
Good to see you.  Schedule MRI brain with and without contrast  2. Continue all your medications  3. Follow-up in 4 months, call for any changes   Seizure Precautions: 1. If medication has been prescribed for you to prevent seizures, take it exactly as directed.  Do not stop taking the medicine without talking to your doctor first, even if you have not had a seizure in a long time.   2. Avoid activities in which a seizure would cause danger to yourself or to others.  Don't operate dangerous machinery, swim alone, or climb in high or dangerous places, such as on ladders, roofs, or girders.  Do not drive unless your doctor says you may.  3. If you have any warning that you may have a seizure, lay down in a safe place where you can't hurt yourself.    4.  No driving for 6 months from last seizure, as per [facility].   Please refer to the following link on the [place] website for more information: http://www.epilepsyfoundation.org/answerplace/Social/driving/drivingu.cfm   5.  Maintain good sleep hygiene. Avoid alcohol.  6.  Contact your doctor if you have any problems that may be related to the medicine you are taking.  7.  Call 911 and bring the patient back to the ED if:        A.  The seizure lasts longer than 5 minutes.       B.  The patient doesn't awaken shortly after the seizure  C.  The patient has new problems such as difficulty seeing, speaking or moving  D.  The patient was injured during the seizure  E.  The patient has a temperature over 102 F (39C)  F.  The patient vomited and now is having trouble breathing
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
## 2022-11-20 ENCOUNTER — Encounter: Payer: Self-pay | Admitting: Neurology

## 2022-11-23 ENCOUNTER — Other Ambulatory Visit: Payer: 59

## 2022-11-25 ENCOUNTER — Ambulatory Visit
Admission: RE | Admit: 2022-11-25 | Discharge: 2022-11-25 | Disposition: A | Discharge status: Home / Self Care | Payer: 59 | Source: Ambulatory Visit | Attending: Neurology | Admitting: Neurology

## 2022-11-25 ENCOUNTER — Telehealth: Payer: Self-pay | Admitting: Neurology

## 2022-11-25 DIAGNOSIS — F32A Depression, unspecified: Secondary | ICD-10-CM

## 2022-11-25 DIAGNOSIS — R200 Anesthesia of skin: Secondary | ICD-10-CM

## 2022-11-25 MED ORDER — GADOPICLENOL 0.5 MMOL/ML IV SOLN
8.0000 mL | Freq: Once | INTRAVENOUS | Status: AC | PRN
  Administered 2022-11-25: 8 mL via INTRAVENOUS

## 2022-11-25 NOTE — Telephone Encounter (Signed)
Patient came to have VNS turned off for MRI today. She reports she has not been doing well, since her last visit a month ago when she reported only 1 brief seizure in a 22-month period, she has had 11 seizures since Thanksgiving. No clear triggers. They feel the lack of activity may be contributing, she used to be very active but now is very depressed. She became tearful reporting that she stays in her night gown for days and does not want to go out of the house. She cannot drive places. She had previously reported similar symptoms a year ago and was prescribed Prozac but decided not to take it. She had taken Prozac when her son was young and it helped for a little then she "did not feel anything" and stopped medication. She is usually a pretty positive person. Discussed referral to Psychiatry, she is agreeable.   Heather, pls send referral to [facility] for Psychiatry evaluation and management of depression. Thanks

## 2022-11-25 NOTE — Addendum Note (Signed)
Addended by: Dimas Chyle on: 11/25/2022 03:04 PM   Modules accepted: Orders

## 2022-11-26 ENCOUNTER — Telehealth: Payer: Self-pay | Admitting: Neurology

## 2022-11-26 NOTE — Telephone Encounter (Signed)
This is a late entry from 11/25/2022 encounter. Patient returned to have VNS turned back on after MRI. She had reported earlier in the day that she had an increase in seizures and depression. We discussed increasing output current on her VNS and monitor for improvement in symptoms. Output current increased to 1.375 mA, Autostim 1.46mA, Magnet 1.634mA.

## 2022-11-27 ENCOUNTER — Telehealth: Payer: Self-pay

## 2022-11-27 NOTE — Telephone Encounter (Signed)
Pt called no answer left a voice mail per DPR  that brain MRI looks good, no tumor, stroke, or bleed. No change from her prior brain scan

## 2022-11-27 NOTE — Telephone Encounter (Signed)
-----   Message from Van Clines, MD sent at 11/27/2022  1:59 PM EST ----- Pls let her know the brain MRI looks good, no tumor, stroke, or bleed. No change from her prior brain scan. Thanks

## 2023-01-28 ENCOUNTER — Other Ambulatory Visit: Payer: Self-pay | Admitting: Neurology

## 2023-02-24 ENCOUNTER — Encounter: Payer: Self-pay | Admitting: Neurology

## 2023-02-24 ENCOUNTER — Ambulatory Visit (INDEPENDENT_AMBULATORY_CARE_PROVIDER_SITE_OTHER): Payer: 59 | Admitting: Neurology

## 2023-02-24 VITALS — BP 128/78 | HR 60 | Ht 66.0 in | Wt 162.4 lb

## 2023-02-24 DIAGNOSIS — R200 Anesthesia of skin: Secondary | ICD-10-CM

## 2023-02-24 DIAGNOSIS — G40209 Localization-related (focal) (partial) symptomatic epilepsy and epileptic syndromes with complex partial seizures, not intractable, without status epilepticus: Secondary | ICD-10-CM
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
## 2023-03-14 ENCOUNTER — Emergency Department (HOSPITAL_COMMUNITY): Payer: 59

## 2023-03-14 ENCOUNTER — Emergency Department (HOSPITAL_COMMUNITY)
Admission: EM | Admit: 2023-03-14 | Discharge: 2023-03-14 | Discharge status: Against Medical Advice | Payer: 59 | Attending: Emergency Medicine | Admitting: Emergency Medicine

## 2023-03-14 DIAGNOSIS — R202 Paresthesia of skin: Secondary | ICD-10-CM | POA: Diagnosis not present

## 2023-03-14 DIAGNOSIS — R42 Dizziness and giddiness: Secondary | ICD-10-CM | POA: Diagnosis not present

## 2023-03-14 DIAGNOSIS — Z5321 Procedure and treatment not carried out due to patient leaving prior to being seen by health care provider: Secondary | ICD-10-CM | POA: Diagnosis not present

## 2023-03-14 DIAGNOSIS — R0602 Shortness of breath: Secondary | ICD-10-CM | POA: Insufficient documentation

## 2023-03-14 LAB — CBC
HCT: 44.2 % (ref 36.0–46.0)
Hemoglobin: 14.6 g/dL (ref 12.0–15.0)
MCH: 31.3 pg (ref 26.0–34.0)
MCHC: 33 g/dL (ref 30.0–36.0)
MCV: 94.6 fL (ref 80.0–100.0)
Platelets: 240 10*3/uL (ref 150–400)
RBC: 4.67 MIL/uL (ref 3.87–5.11)
RDW: 12.4 % (ref 11.5–15.5)
WBC: 8.3 10*3/uL (ref 4.0–10.5)
nRBC: 0 % (ref 0.0–0.2)

## 2023-03-14 LAB — COMPREHENSIVE METABOLIC PANEL
ALT: 19 U/L (ref 0–44)
AST: 20 U/L (ref 15–41)
Albumin: 4.4 g/dL (ref 3.5–5.0)
Alkaline Phosphatase: 86 U/L (ref 38–126)
Anion gap: 8 (ref 5–15)
BUN: 19 mg/dL (ref 8–23)
CO2: 20 mmol/L — ABNORMAL LOW (ref 22–32)
Calcium: 9 mg/dL (ref 8.9–10.3)
Chloride: 106 mmol/L (ref 98–111)
Creatinine, Ser: 0.92 mg/dL (ref 0.44–1.00)
GFR, Estimated: >60 mL/min (ref >60)
Glucose, Bld: 138 mg/dL — ABNORMAL HIGH (ref 70–99)
Potassium: 3.8 mmol/L (ref 3.5–5.1)
Sodium: 134 mmol/L — ABNORMAL LOW (ref 135–145)
Total Bilirubin: 0.7 mg/dL (ref 0.3–1.2)
Total Protein: 7.2 g/dL (ref 6.5–8.1)

## 2023-03-14 NOTE — ED Triage Notes (Signed)
Pt arrived POV, c/o SOB, denies CP, has nausea, bilateral ear ringing. Also reports bilateral finger and toes tingling sensation that has subsided that started around 6:45 pm tonight. Some dizziness intermittent, denies headache. Pt A&O x4, NAD noted, VSS.

## 2023-03-14 NOTE — ED Notes (Signed)
Pt handed labels to registration and stated they were leaving due to wait

## 2023-04-11 ENCOUNTER — Telehealth: Payer: Self-pay | Admitting: Neurology

## 2023-04-11 ENCOUNTER — Other Ambulatory Visit: Payer: Self-pay

## 2023-04-11 NOTE — Telephone Encounter (Signed)
This is the new pharmacy for the the vimpat, it is a lower [address]

## 2023-04-11 NOTE — Telephone Encounter (Signed)
Pt called in and left a message with the access nurse. She is returning a call from the office.

## 2023-04-14 MED ORDER — VIMPAT 200 MG PO TABS
200.0000 mg | ORAL_TABLET | Freq: Two times a day (BID) | ORAL | 5 refills | Status: DC
Start: 2023-04-14 — End: 2023-10-30

## 2023-04-14 NOTE — Telephone Encounter (Signed)
[facility]. Thanks

## 2023-04-15 ENCOUNTER — Encounter: Payer: Self-pay | Admitting: Neurology

## 2023-04-15 ENCOUNTER — Other Ambulatory Visit: Payer: Self-pay

## 2023-04-15 NOTE — Telephone Encounter (Signed)
Pt called to call office back to let me know what pharmacy to send her RX to

## 2023-04-17 ENCOUNTER — Other Ambulatory Visit: Payer: Self-pay

## 2023-04-17 ENCOUNTER — Telehealth: Payer: Self-pay

## 2023-04-17 MED ORDER — ZONISAMIDE 100 MG PO CAPS: ORAL_CAPSULE | ORAL | 3 refills | Status: DC

## 2023-04-17 NOTE — Telephone Encounter (Signed)
Pt called stating she uses [facility] on [address] and [address]. States it needs to be [address].

## 2023-04-17 NOTE — Telephone Encounter (Signed)
Refill sent in for pt [facility] on [address] and [address]. States it needs to be [facility] [address].

## 2023-04-21 IMAGING — DX DG FINGER INDEX 2+V*L*
3 series · 3 of 3 positions shown · non-contrast
Comparison: None Available.

CLINICAL DATA: Left index finger laceration and pain.

EXAM:
LEFT INDEX FINGER 2+V

[finger ap]
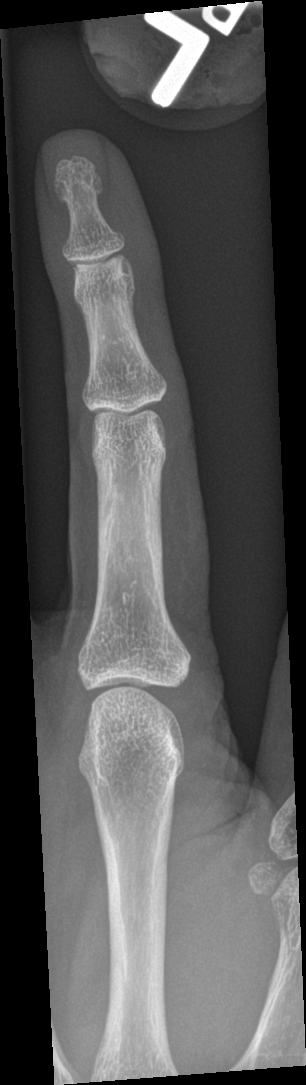

[finger obl]
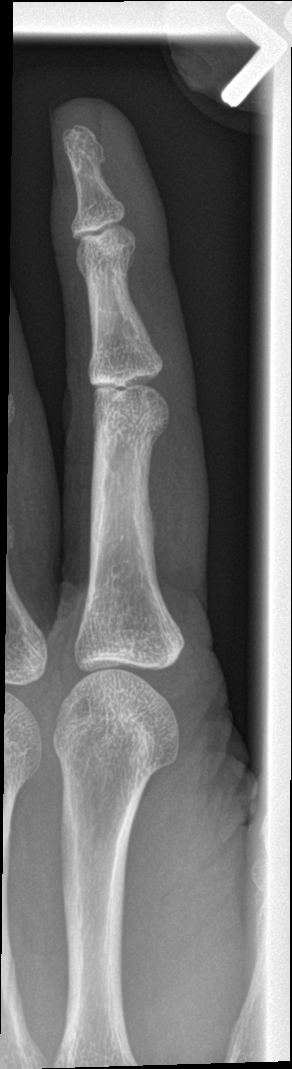

[finger lat]
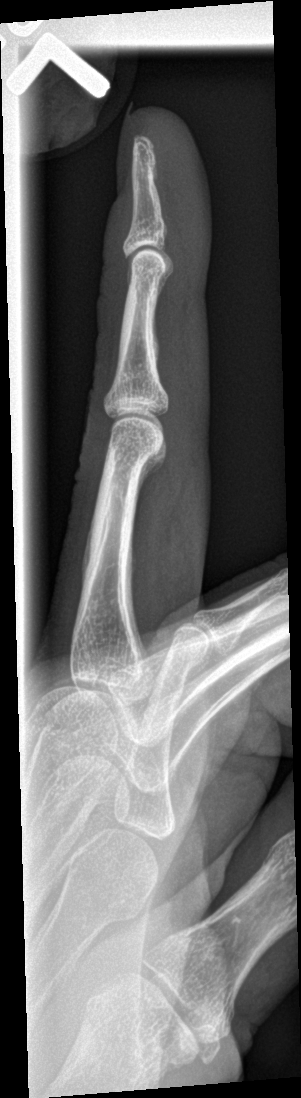

[3 of 3 positions shown; findings below may reference images not displayed]

FINDINGS: There is no evidence of fracture or dislocation. There is no
evidence of arthropathy or other focal bone abnormality. Soft
tissues are unremarkable.
IMPRESSION: Negative.

## 2023-06-09 ENCOUNTER — Encounter: Payer: Self-pay | Admitting: Neurology

## 2023-06-09 ENCOUNTER — Ambulatory Visit (INDEPENDENT_AMBULATORY_CARE_PROVIDER_SITE_OTHER): Payer: 59 | Admitting: Neurology

## 2023-06-09 VITALS — BP 122/76 | HR 66 | Ht 66.0 in | Wt 159.0 lb

## 2023-06-09 DIAGNOSIS — G40209 Localization-related (focal) (partial) symptomatic epilepsy and epileptic syndromes with complex partial seizures, not intractable, without status epilepticus: Secondary | ICD-10-CM

## 2023-06-09 NOTE — Patient Instructions (Signed)
Always good to see you.  Start Xcopri 12.5mg  every night for 4 weeks, then take the 25mg  every night for 4 weeks  2. Continue with all your medications. Let me know if you are too dizzy, we will reduce Vimpat  3. Follow-up in 7-8 weeks, call for any changes
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
## 2023-06-29 ENCOUNTER — Encounter: Payer: Self-pay | Admitting: Neurology

## 2023-08-01 ENCOUNTER — Ambulatory Visit (INDEPENDENT_AMBULATORY_CARE_PROVIDER_SITE_OTHER): Payer: 59 | Admitting: Neurology

## 2023-08-01 ENCOUNTER — Encounter: Payer: Self-pay | Admitting: Neurology

## 2023-08-01 VITALS — BP 113/76 | HR 72 | Ht 66.0 in | Wt 159.0 lb

## 2023-08-01 DIAGNOSIS — G40209 Localization-related (focal) (partial) symptomatic epilepsy and epileptic syndromes with complex partial seizures, not intractable, without status epilepticus: Secondary | ICD-10-CM | POA: Diagnosis not present

## 2023-08-01 NOTE — Progress Notes (Unsigned)
Medication Samples have been provided to the patient.  Drug name: xcopri       Strength: 12.5/25        Qty: 2  LOT: [id number]  Exp.Date: 08/2023   Dosing instructions: take as directed   The patient has been instructed regarding the correct time, dose, and frequency of taking this medication, including desired effects and most common side effects.

## 2023-08-01 NOTE — Patient Instructions (Addendum)
Always good to see you. Continue Xcopri 25mg  every night, try taking it at dinner and see if this helps better with drowsiness. We will get in touch with Xcopri representative about coverage issues. Continue all other medications. Follow-up in 2-3 months, call for any changes   Seizure Precautions: 1. If medication has been prescribed for you to prevent seizures, take it exactly as directed.  Do not stop taking the medicine without talking to your doctor first, even if you have not had a seizure in a long time.   2. Avoid activities in which a seizure would cause danger to yourself or to others.  Don't operate dangerous machinery, swim alone, or climb in high or dangerous places, such as on ladders, roofs, or girders.  Do not drive unless your doctor says you may.  3. If you have any warning that you may have a seizure, lay down in a safe place where you can't hurt yourself.    4.  No driving for 6 months from last seizure, as per [facility].   Please refer to the following link on the Epilepsy Foundation of America's website for more information: http://www.epilepsyfoundation.org/answerplace/Social/driving/drivingu.cfm   5.  Maintain good sleep hygiene. Avoid alcohol.  6.  Contact your doctor if you have any problems that may be related to the medicine you are taking.  7.  Call 911 and bring the patient back to the ED if:        A.  The seizure lasts longer than 5 minutes.       B.  The patient doesn't awaken shortly after the seizure  C.  The patient has new problems such as difficulty seeing, speaking or moving  D.  The patient was injured during the seizure  E.  The patient has a temperature over 102 F (39C)  F.  The patient vomited and now is having trouble breathing
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
## 2023-08-14 ENCOUNTER — Other Ambulatory Visit (HOSPITAL_COMMUNITY): Payer: Self-pay

## 2023-08-22 ENCOUNTER — Other Ambulatory Visit (HOSPITAL_COMMUNITY): Payer: Self-pay

## 2023-08-22 ENCOUNTER — Telehealth: Payer: Self-pay | Admitting: Pharmacy Technician

## 2023-08-22 NOTE — Telephone Encounter (Signed)
Pharmacy Patient Advocate Encounter   Received notification from Physician's Office that prior authorization for XCOPRI  is required/requested.   Insurance verification completed.   The patient is insured through [facility] .   Per test claim: PA required; PA submitted to [facility] via CoverMyMeds Key/confirmation #/[facility] Status is pending

## 2023-08-25 ENCOUNTER — Encounter: Payer: Self-pay | Admitting: Neurology

## 2023-08-28 NOTE — Telephone Encounter (Signed)
Pharmacy Patient Advocate Encounter  Received notification from [facility] that Prior Authorization for Xcopri 14 x 12.5 MG &14 x 25 MG tablets has been DENIED.  Full denial letter will be uploaded to the media tab. See denial reason below.  The request for coverage for XCOPRI PAK 12.5-25, use as directed (28 per 28 days), is denied. This decision is based on health plan criteria for XCOPRI PAK 12.5-25. This medicine is covered only if: You have failed or cannot use two of the following:  (1) Carbamazepine (generic Tegretol) (2)Gabapentin (generic Neurontin) (3) Oxcarbazepine (generic Trileptal) (4) Phenytoin (generic Dilantin)  PA #/Case ID/Reference #: [id number]  Please be advised we currently do not have a Pharmacist to review denials, therefore you will need to process appeals accordingly as needed. Thanks for your support at this time. Contact for appeals are as follows: Phone: [phone], Fax: [phone]   Last day to appeal is 180 calendar days from date of this letter (08-25-2023)

## 2023-09-03 ENCOUNTER — Encounter: Payer: Self-pay | Admitting: Neurology

## 2023-09-05 MED ORDER — OXCARBAZEPINE 150 MG PO TABS: 150.0000 mg | ORAL_TABLET | Freq: Two times a day (BID) | ORAL | 4 refills | Status: DC

## 2023-09-23 MED ORDER — OXCARBAZEPINE 150 MG PO TABS: ORAL_TABLET | ORAL | 4 refills | Status: DC

## 2023-09-23 NOTE — Addendum Note (Signed)
Addended by: Van Clines on: 09/23/2023 10:47 AM   Modules accepted: Orders

## 2023-10-08 ENCOUNTER — Telehealth: Payer: Self-pay | Admitting: Neurology

## 2023-10-08 NOTE — Telephone Encounter (Signed)
Called patient and gave her Dr. Karel Jarvis recommendations she understood

## 2023-10-08 NOTE — Telephone Encounter (Signed)
Caller stated wife accidentally took double dose of medication this morning and wants to know if she's okay. Would like call back from nurse. Caller stated patient took OXcarbazepine (TRILEPTAL) 150 MG tablet [[id number]] and zonisamide (ZONEGRAN) 100 MG capsule [[id number]]

## 2023-10-08 NOTE — Telephone Encounter (Signed)
Pls let her know that is okay, she may just be a little dizzy/drowsy, but continue taking regular dose tonight. Thanks

## 2023-10-15 ENCOUNTER — Encounter: Payer: Self-pay | Admitting: Neurology

## 2023-10-15 ENCOUNTER — Ambulatory Visit (INDEPENDENT_AMBULATORY_CARE_PROVIDER_SITE_OTHER): Payer: 59 | Admitting: Neurology

## 2023-10-15 VITALS — BP 122/81 | HR 74 | Ht 66.0 in | Wt 158.0 lb

## 2023-10-15 DIAGNOSIS — G40209 Localization-related (focal) (partial) symptomatic epilepsy and epileptic syndromes with complex partial seizures, not intractable, without status epilepticus: Secondary | ICD-10-CM | POA: Diagnosis not present

## 2023-10-15 MED ORDER — LORAZEPAM 0.5 MG PO TABS: ORAL_TABLET | ORAL | 5 refills | Status: DC

## 2023-10-15 NOTE — Patient Instructions (Signed)
Always a pleasure to see you.  Let's reduce the Oxcarbazepine 150mg : take 1 and 1/2 tablets in AM, 2 tablets in PM. If still having the same vision changes and tremors, try reducing to 1 tablet in AM, 2 tablets in PM  2. Continue all your other medications  3. Follow-up in 3 months, call for any changes   Seizure Precautions: 1. If medication has been prescribed for you to prevent seizures, take it exactly as directed.  Do not stop taking the medicine without talking to your doctor first, even if you have not had a seizure in a long time.   2. Avoid activities in which a seizure would cause danger to yourself or to others.  Don't operate dangerous machinery, swim alone, or climb in high or dangerous places, such as on ladders, roofs, or girders.  Do not drive unless your doctor says you may.  3. If you have any warning that you may have a seizure, lay down in a safe place where you can't hurt yourself.    4.  No driving for 6 months from last seizure, as per [facility].   Please refer to the following link on the Epilepsy Foundation of America's website for more information: http://www.epilepsyfoundation.org/answerplace/Social/driving/drivingu.cfm   5.  Maintain good sleep hygiene. Avoid alcohol.  6.  Contact your doctor if you have any problems that may be related to the medicine you are taking.  7.  Call 911 and bring the patient back to the ED if:        A.  The seizure lasts longer than 5 minutes.       B.  The patient doesn't awaken shortly after the seizure  C.  The patient has new problems such as difficulty seeing, speaking or moving  D.  The patient was injured during the seizure  E.  The patient has a temperature over 102 F (39C)  F.  The patient vomited and now is having trouble breathing
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
## 2023-10-29 ENCOUNTER — Other Ambulatory Visit: Payer: Self-pay | Admitting: Neurology

## 2023-11-27 ENCOUNTER — Encounter: Payer: Self-pay | Admitting: Neurology

## 2023-12-31 ENCOUNTER — Encounter: Payer: Self-pay | Admitting: Neurology

## 2024-01-15 NOTE — Progress Notes (Deleted)
 63 y.o. No obstetric history on file. Married Caucasian female here for NEW GYN/annual exam.    PCP: Nathaneil Canary, PA-C   No LMP recorded. Patient is postmenopausal.           Sexually active: Yes.    The current method of family planning is {contraception:315051}.    Menopausal hormone therapy:  n/a Exercising: {yes no:314532}  {types:19826} Smoker:  no  OB History  No obstetric history on file.     HEALTH MAINTENANCE: Last 2 paps:  *** History of abnormal Pap or positive HPV:  {YES NO:22349} Mammogram:   *** Colonoscopy:   Bone Density:  ***  Result  ***   Immunization History  Administered Date(s) Administered   Moderna Sars-Covid-2 Vaccination 02/25/2020, 03/24/2020, 11/18/2020      reports that she has never smoked. She has never used smokeless tobacco. She reports that she does not currently use alcohol. She reports that she does not use drugs.  Past Medical History:  Diagnosis Date   History of IBS    Temporal lobe epilepsy [facility])     Past Surgical History:  Procedure Laterality Date   COLONOSCOPY     DENTAL SURGERY     VNS  07/07/2014    Current Outpatient Medications  Medication Sig Dispense Refill   Cholecalciferol (VITAMIN D-3) 125 MCG (5000 UT) TABS Take by mouth.     cyanocobalamin (VITAMIN B12) 500 MCG tablet Take 500 mcg by mouth daily.     LORazepam (ATIVAN) 0.5 MG tablet TAKE 1 TABLET BY MOUTH AS NEEDED FOR SEIZURE CLUSTERS. MAX OF 2 PER DAY. DO NOT TAKE MORE THAN 3 TABLETS PER WEEK 10 tablet 5   OXcarbazepine (TRILEPTAL) 150 MG tablet Take 2 tablets twice a day 120 tablet 4   rosuvastatin (CRESTOR) 5 MG tablet Take 5 mg by mouth daily.     VIMPAT 200 MG TABS tablet TAKE ONE TABLET BY MOUTH TWICE DAILY 60 tablet 5   zonisamide (ZONEGRAN) 100 MG capsule Take 2 capsules in AM, 3 capsules in PM 450 capsule 3   No current facility-administered medications for this visit.    ALLERGIES: Lamotrigine and Latex  No family history on  file.  Review of Systems  PHYSICAL EXAM:  There were no vitals taken for this visit.    General appearance: alert, cooperative and appears stated age Head: normocephalic, without obvious abnormality, atraumatic Neck: no adenopathy, supple, symmetrical, trachea midline and thyroid normal to inspection and palpation Lungs: clear to auscultation bilaterally Breasts: normal appearance, no masses or tenderness, No nipple retraction or dimpling, No nipple discharge or bleeding, No axillary adenopathy Heart: regular rate and rhythm Abdomen: soft, non-tender; no masses, no organomegaly Extremities: extremities normal, atraumatic, no cyanosis or edema Skin: skin color, texture, turgor normal. No rashes or lesions Lymph nodes: cervical, supraclavicular, and axillary nodes normal. Neurologic: grossly normal  Pelvic: External genitalia:  no lesions              No abnormal inguinal nodes palpated.              Urethra:  normal appearing urethra with no masses, tenderness or lesions              Bartholins and Skenes: normal                 Vagina: normal appearing vagina with normal color and discharge, no lesions              Cervix: no  lesions              Pap taken: {yes no:314532} Bimanual Exam:  Uterus:  normal size, contour, position, consistency, mobility, non-tender              Adnexa: no mass, fullness, tenderness              Rectal exam: {yes no:314532}.  Confirms.              Anus:  normal sphincter tone, no lesions  Chaperone was present for exam:  {BSCHAPERONE:31226::"Jakson Delpilar F, CMA"}  ASSESSMENT: Well woman visit with gynecologic exam  ***  PLAN: Mammogram screening discussed. Self breast awareness reviewed. Pap and HRV collected:  {yes no:314532} Guidelines for Calcium, Vitamin D, regular exercise program including cardiovascular and weight bearing exercise. Medication refills:  *** {LABS (Optional):23779} Follow up:  ***    Additional counseling given.  {yes  [id number]. ***  total time was spent for this patient encounter, including preparation, face-to-face counseling with the patient, coordination of care, and documentation of the encounter in addition to doing the well woman visit with gynecologic exam.

## 2024-01-23 ENCOUNTER — Ambulatory Visit (INDEPENDENT_AMBULATORY_CARE_PROVIDER_SITE_OTHER): Payer: 59 | Admitting: Neurology

## 2024-01-23 ENCOUNTER — Encounter: Payer: Self-pay | Admitting: Neurology

## 2024-01-23 VITALS — BP 119/81 | HR 70 | Resp 18 | Ht 66.0 in | Wt 158.0 lb

## 2024-01-23 DIAGNOSIS — G40209 Localization-related (focal) (partial) symptomatic epilepsy and epileptic syndromes with complex partial seizures, not intractable, without status epilepticus: Secondary | ICD-10-CM | POA: Diagnosis not present

## 2024-01-23 MED ORDER — ZONISAMIDE 100 MG PO CAPS: ORAL_CAPSULE | ORAL | 3 refills | Status: DC
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
## 2024-01-23 NOTE — Patient Instructions (Addendum)
 fGood to see you.   Starting tonight, reduce Oxcarbazepine  150mg : take 2 tablets twice a day for 1 week, then reduce to 1 tablet twice a day for 1 week, then reduce to 1 tablet every night for a week, then stop  2. After 1-2 weeks, hopefully you start feeling better  3. Continue Vimpat  and Zonisamide   4. Follow-up in 3 months, call for any changes   Seizure Precautions: 1. If medication has been prescribed for you to prevent seizures, take it exactly as directed.  Do not stop taking the medicine without talking to your doctor first, even if you have not had a seizure in a long time.   2. Avoid activities in which a seizure would cause danger to yourself or to others.  Don't operate dangerous machinery, swim alone, or climb in high or dangerous places, such as on ladders, roofs, or girders.  Do not drive unless your doctor says you may.  3. If you have any warning that you may have a seizure, lay down in a safe place where you can't hurt yourself.    4.  No driving for 6 months from last seizure, as per [place]  state law.   Please refer to the following link on the Epilepsy Foundation of America's website for more information: http://www.epilepsyfoundation.org/answerplace/Social/driving/drivingu.cfm   5.  Maintain good sleep hygiene. Avoid alcohol.  6.  Contact your doctor if you have any problems that may be related to the medicine you are taking.  7.  Call 911 and bring the patient back to the ED if:        A.  The seizure lasts longer than 5 minutes.       B.  The patient doesn't awaken shortly after the seizure  C.  The patient has new problems such as difficulty seeing, speaking or moving  D.  The patient was injured during the seizure  E.  The patient has a temperature over 102 F (39C)  F.  The patient vomited and now is having trouble breathing

## 2024-01-24 ENCOUNTER — Other Ambulatory Visit: Payer: Self-pay | Admitting: Neurology

## 2024-01-26 MED ORDER — OXCARBAZEPINE 150 MG PO TABS: ORAL_TABLET | ORAL | 0 refills | Status: DC

## 2024-01-29 ENCOUNTER — Encounter: Payer: 59 | Admitting: Obstetrics and Gynecology

## 2024-02-11 NOTE — Progress Notes (Unsigned)
 63 y.o. G1P0 Married Caucasian female here for NEW GYN/annual exam.  Pt does struggle with bladder control due to seizures.  Hysterectomy due to prolapse in 2005.  Had a prolapse repair in 2018.  Not certain of the procedure.   Has dx of late onset epilepsy, at age 86 yo.  They can occur a few in a week.  Seeing a specialist in [place].  Considering surgical care through [facility].   Has had a vagus nerve stimulator, which worked for a while to control the seizures.  Not able to drive, misses working, and would like for her health to be different. Has support of some close friends.  Was living in [place].  Moved here about 3 years ago.  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 Husband is working from home.  Loves to read.   PCP: Leonard Downing  Sees Cardiology.  Sees Neurology.  Sees GI.  No LMP recorded. Patient has had a hysterectomy.           Sexually active:  No. The current method of family planning is hyst.    Menopausal hormone therapy:  n/a Exercising: No.  Pt has been struggling with seizures Smoker:  no  OB History  Gravida Para Term Preterm AB Living  1     1  SAB IAB Ectopic Multiple Live Births      1    # Outcome Date GA Lbr Len/2nd Weight Sex Type Anes PTL Lv  1 Gravida              HEALTH MAINTENANCE: Last 2 paps:  before hyst History of abnormal Pap or positive HPV:  yes, once many years ago Mammogram:   11/04/23 ([facility]) - BI-RADS2.  Colonoscopy:  2024.  Has hiatal hernia.   Bone Density:  n/a  Result  n/a   Immunization History  Administered Date(s) Administered   Moderna Sars-Covid-2 Vaccination 02/25/2020, 03/24/2020, 11/18/2020      reports that she has never smoked. She has never used smokeless tobacco. She reports that she does not currently use alcohol. She reports that she does not use drugs.  Past Medical History:  Diagnosis Date   History of IBS    Temporal lobe epilepsy [facility])     Past Surgical History:   Procedure Laterality Date   APPENDECTOMY     COLONOSCOPY     DENTAL SURGERY     PARTIAL HYSTERECTOMY  2005   VNS  07/07/2014    Current Outpatient Medications  Medication Sig Dispense Refill   Cholecalciferol (VITAMIN D-3) 125 MCG (5000 UT) TABS Take by mouth.     cyanocobalamin (VITAMIN B12) 500 MCG tablet Take 500 mcg by mouth daily.     LORazepam (ATIVAN) 0.5 MG tablet TAKE 1 TABLET BY MOUTH AS NEEDED FOR SEIZURE CLUSTERS. MAX OF 2 PER DAY. DO NOT TAKE MORE THAN 3 TABLETS PER WEEK 10 tablet 5   OXcarbazepine (TRILEPTAL) 150 MG tablet Weaning off instructions: Take 2 tablets twice a day for 1 week, then reduce to 1 tablet twice a day for 1 week, then reduce to 1 tablet every night for a week, then stop 30 tablet 0   rosuvastatin (CRESTOR) 5 MG tablet Take 5 mg by mouth daily.     VIMPAT 200 MG TABS tablet TAKE ONE TABLET BY MOUTH TWICE DAILY 60 tablet 5   zonisamide (ZONEGRAN) 100 MG capsule Take 2 capsules in AM, 3 capsules in PM 450 capsule 3  No current facility-administered medications for this visit.    ALLERGIES: Lamotrigine and Latex  Family History  Problem Relation Age of Onset   Rheum arthritis Mother    Rheum arthritis Father    Rheum arthritis Maternal Grandmother    ALS Paternal Aunt    Breast cancer Paternal Aunt     Review of Systems  All other systems reviewed and are negative.   PHYSICAL EXAM:  BP 128/84 (BP Location: Right Arm, Patient Position: Sitting, Cuff Size: Small)   Ht 5' 6.5" (1.689 m)   Wt 155 lb (70.3 kg)   BMI 24.64 kg/m     General appearance: alert, cooperative and appears stated age Head: normocephalic, without obvious abnormality, atraumatic Neck: no adenopathy, supple, symmetrical, trachea midline and thyroid normal to inspection and palpation Lungs: clear to auscultation bilaterally Breasts: normal appearance, no masses or tenderness, No nipple retraction or dimpling, No nipple discharge or bleeding, No axillary adenopathy Heart:  regular rate and rhythm Abdomen: soft, non-tender; no masses, no organomegaly Extremities: extremities normal, atraumatic, no cyanosis or edema Skin: skin color, texture, turgor normal. No rashes or lesions Lymph nodes: cervical, supraclavicular, and axillary nodes normal. Neurologic: grossly normal  Pelvic: External genitalia:  no lesions              No abnormal inguinal nodes palpated.              Urethra:  normal appearing urethra with no masses, tenderness or lesions              Bartholins and Skenes: normal                 Vagina: normal appearing vagina with normal color and discharge, no lesions.  Atrophy noted and discomfort with speculum exam.               Cervix: absent               Pap taken: no Bimanual Exam:  Uterus:  absent              Adnexa: no mass, fullness, tenderness              Rectal exam: yes.  Confirms.              Anus:  normal sphincter tone, no lesions  Chaperone was present for exam:  Warren Lacy, CMA  ASSESSMENT: Well woman visit with gynecologic exam Status post vaginal hysterectomy for prolapse in 2005. Status post possible sacrocolpopexy in 2018 in [place].  Atrophy.  Seizure disorder.  Health stress.  PHQ2:  0   PLAN: Mammogram screening discussed. Self breast awareness reviewed. Pap and HRV collected:  no.  Not indicated.  Guidelines for Calcium, Vitamin D, regular exercise program including cardiovascular and weight bearing exercise. Medication refills:  NA We discussed tx options for atrophy:  prescription vaginal estrogens, water based lubricants, cooking oils.   No Rx given today.  Will get copies of her operative reports.  She will do bone density at [facility]. Labs with PCP.  Support given for her health stress.   I encouraged widening her circle of support.  Follow up:  yearly and prn.
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
## 2024-02-25 ENCOUNTER — Ambulatory Visit (INDEPENDENT_AMBULATORY_CARE_PROVIDER_SITE_OTHER): Payer: 59 | Admitting: Obstetrics and Gynecology

## 2024-02-25 ENCOUNTER — Encounter: Payer: Self-pay | Admitting: Obstetrics and Gynecology

## 2024-02-25 VITALS — BP 128/84 | Ht 66.5 in | Wt 155.0 lb

## 2024-02-25 DIAGNOSIS — N952 Postmenopausal atrophic vaginitis: Secondary | ICD-10-CM | POA: Diagnosis not present

## 2024-02-25 DIAGNOSIS — Z1331 Encounter for screening for depression: Secondary | ICD-10-CM | POA: Diagnosis not present

## 2024-02-25 DIAGNOSIS — Z01419 Encounter for gynecological examination (general) (routine) without abnormal findings: Secondary | ICD-10-CM

## 2024-02-25 DIAGNOSIS — Z780 Asymptomatic menopausal state: Secondary | ICD-10-CM

## 2024-02-25 DIAGNOSIS — K589 Irritable bowel syndrome without diarrhea: Secondary | ICD-10-CM | POA: Insufficient documentation

## 2024-02-25 NOTE — Patient Instructions (Addendum)
 Phone number for scheduling bone density at [facility], [phone].    EXERCISE AND DIET:  We recommended that you start or continue a regular exercise program for good health. Regular exercise means any activity that makes your heart beat faster and makes you sweat.  We recommend exercising at least 30 minutes per day at least 3 days a week, preferably 4 or 5.  We also recommend a diet low in fat and sugar.  Inactivity, poor dietary choices and obesity can cause diabetes, heart attack, stroke, and kidney damage, among others.    ALCOHOL AND SMOKING:  Women should limit their alcohol intake to no more than 7 drinks/beers/glasses of wine (combined, not each!) per week. Moderation of alcohol intake to this level decreases your risk of breast cancer and liver damage. And of course, no recreational drugs are part of a healthy lifestyle.  And absolutely no smoking or even second hand smoke. Most people know smoking can cause heart and lung diseases, but did you know it also contributes to weakening of your bones? Aging of your skin?  Yellowing of your teeth and nails?  CALCIUM AND VITAMIN D:  Adequate intake of calcium and Vitamin D are recommended.  The recommendations for exact amounts of these supplements seem to change often, but generally speaking 600 mg of calcium (either carbonate or citrate) and 800 units of Vitamin D per day seems prudent. Certain women may benefit from higher intake of Vitamin D.  If you are among these women, your doctor will have told you during your visit.    PAP SMEARS:  Pap smears, to check for cervical cancer or precancers,  have traditionally been done yearly, although recent scientific advances have shown that most women can have pap smears less often.  However, every woman still should have a physical exam from her gynecologist every year. It will include a breast check, inspection of the vulva and vagina to check for abnormal growths or skin changes, a visual exam of the  cervix, and then an exam to evaluate the size and shape of the uterus and ovaries.  And after 64 years of age, a rectal exam is indicated to check for rectal cancers. We will also provide age appropriate advice regarding health maintenance, like when you should have certain vaccines, screening for sexually transmitted diseases, bone density testing, colonoscopy, mammograms, etc.   MAMMOGRAMS:  All women over 6 years old should have a yearly mammogram. Many facilities now offer a "3D" mammogram, which may cost around $50 extra out of pocket. If possible,  we recommend you accept the option to have the 3D mammogram performed.  It both reduces the number of women who will be called back for extra views which then turn out to be normal, and it is better than the routine mammogram at detecting truly abnormal areas.    COLONOSCOPY:  Colonoscopy to screen for colon cancer is recommended for all women at age 70.  We know, you hate the idea of the prep.  We agree, BUT, having colon cancer and not knowing it is worse!!  Colon cancer so often starts as a polyp that can be seen and removed at colonscopy, which can quite literally save your life!  And if your first colonoscopy is normal and you have no family history of colon cancer, most women don't have to have it again for 10 years.  Once every ten years, you can do something that may end up saving your life, right?  We will  be happy to help you get it scheduled when you are ready.  Be sure to check your insurance coverage so you understand how much it will cost.  It may be covered as a preventative service at no cost, but you should check your particular policy.

## 2024-03-09 ENCOUNTER — Other Ambulatory Visit: Payer: Self-pay | Admitting: Neurology

## 2024-03-09 ENCOUNTER — Ambulatory Visit (HOSPITAL_BASED_OUTPATIENT_CLINIC_OR_DEPARTMENT_OTHER)
Admission: RE | Admit: 2024-03-09 | Discharge: 2024-03-09 | Disposition: A | Discharge status: Home / Self Care | Source: Ambulatory Visit | Attending: Obstetrics and Gynecology | Admitting: Obstetrics and Gynecology

## 2024-03-09 DIAGNOSIS — Z780 Asymptomatic menopausal state: Secondary | ICD-10-CM

## 2024-03-18 ENCOUNTER — Encounter: Payer: Self-pay | Admitting: Obstetrics and Gynecology

## 2024-03-24 NOTE — Progress Notes (Unsigned)
 GYNECOLOGY  VISIT   HPI: 63 y.o.   Married  Caucasian female   G1P0 with No LMP recorded. Patient has had a hysterectomy.   here for: dexa results.  Patient's husband is present for the visit today.     Patient is very worried about her new dx of osteoporosis.  She declines treatment.  Has dental implants and has done several dental grafts.    Takes vit D3 and takes vit B12.   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   [place].  Study Result  Narrative & Impression  EXAM: DUAL X-RAY ABSORPTIOMETRY (DXA) FOR BONE MINERAL DENSITY   IMPRESSION: Referring Physician:  Patton Salles Your patient completed a bone mineral density test using GE Lunar iDXA system (analysis version: 16). Technologist: AWL/BPM PATIENT: Name: Deborah Lynn, Deborah Lynn Patient ID: [id number] Birth Date: Oct 03, 1961 Height: 66.5 in. Sex: Female Measured: 03/09/2024 Weight: 155.0 lbs. Indications: Family History of Osteoporosis, History of Fracture (Adult), Hysterectomy, Tileptal, Zonegran Fractures: Ankle, Elbow Treatments: Vitamin D   ASSESSMENT: The BMD measured at the LEFT femoral neck is 0.597 g/cm2 with a T-score of -3.2. This patient's diagnostic category is OSTEOPOROSIS according to World Health Organization [facility]) criteria. Scan quality is good. Exclusions: L3, L4 due to degenerative changes.   Site Region Measured Date Measured Age YA BMD Significant CHANGE T-score DualFemur Neck Left  2/09/2023    63.5         -3.2    0.597 g/cm2   AP Spine  L1-L2      2/09/2023    63.5         -2.7    0.843 g/cm2   DualFemur Total Mean 2/09/2023    63.5         -2.8    0.656 g/cm2   World Health Organization [facility]) criteria for post-menopausal, Caucasian Women: Normal       T-score at or above -1 SD Osteopenia   T-score between -1 and -2.5 SD Osteoporosis T-score at or below -2.5 SD   RECOMMENDATION: 1. All patients should optimize  calcium and vitamin D intake. 2. Consider FDA approved medical therapies in postmenopausal women and men aged 81 years and older, based on the following: a. A hip or vertebral (clinical or morphometric) fracture b. T-score = -2.5 at the femoral neck or spine after appropriate evaluation to exclude secondary causes c. Low bone mass (T-score between -1.0 and -2.5 at the femoral neck or spine) and a 10- year probability of a hip fracture = 3% or a 10 year probability of a major osteoporosis-related fracture = 20% based on the [place]-adapted WHO algorithm. 3. Clinician judgement and/or patient preference may indicate treatment for people with10-year fracture probabilities above or below these levels.   FOLLOW-UP: Patients with diagnosis of osteoporosis or at high risk for fracture should have regular bone mineral density tests. For patients eligible for Medicare routine testing is allowed once every 2 years. The testing frequency can be increased to one year for patients who have rapidly progressing disease, those who are receiving or discontinuing medical therapy to restore bone mass, or have additional risk factors.   I have reviewed this study and agree with the findings. [facility]     Electronically Signed   By: Harmon Pier M.D.   On: 03/09/2024 13:00      GYNECOLOGIC HISTORY: No LMP recorded. Patient has had a hysterectomy. Contraception:  hyst Menopausal hormone therapy:  n/a Last 2 paps:  before hyst History of abnormal Pap or positive HPV:  yes, many years ago Mammogram:   11/04/23 ([facility]) - BI-RADS2.         OB History     Gravida  1   Para      Term      Preterm      AB      Living  1      SAB      IAB      Ectopic      Multiple      Live Births  1              Patient Active Problem List   Diagnosis Date Noted   IBS (irritable bowel syndrome) 02/25/2024   Vitamin D deficiency 10/12/2019   Carotid artery stenosis,  asymptomatic, right 05/21/2018   Epilepsy (HCC) 10/31/2017   Pelvic organ prolapse quantification stage 3 cystocele 09/19/2017    Past Medical History:  Diagnosis Date   Eroded bladder suspension mesh (HCC)    History of IBS    Osteoporosis    Temporal lobe epilepsy (HCC)     Past Surgical History:  Procedure Laterality Date   APPENDECTOMY     COLONOSCOPY     DENTAL SURGERY     PARTIAL HYSTERECTOMY  2005   VNS  07/07/2014    Current Outpatient Medications  Medication Sig Dispense Refill   CALCIUM PO Take by mouth.     Cholecalciferol (VITAMIN D-3) 125 MCG (5000 UT) TABS Take by mouth.     cyanocobalamin (VITAMIN B12) 500 MCG tablet Take 500 mcg by mouth daily.     LORazepam (ATIVAN) 0.5 MG tablet TAKE 1 TABLET BY MOUTH AS NEEDED FOR SEIZURE CLUSTERS. MAX OF 2 PER DAY. DO NOT TAKE MORE THAN 3 TABLETS PER WEEK 10 tablet 5   Menaquinone-7 (K2 PO) Take by mouth.     rosuvastatin (CRESTOR) 5 MG tablet Take 5 mg by mouth daily.     VIMPAT 200 MG TABS tablet TAKE ONE TABLET BY MOUTH TWICE DAILY 60 tablet 5   zonisamide (ZONEGRAN) 100 MG capsule Take 2 capsules in AM, 3 capsules in PM 450 capsule 3   No current facility-administered medications for this visit.     ALLERGIES: Diphenhydramine, Mixed ragweed, Lamotrigine, and Latex  Family History  Problem Relation Age of Onset   Rheum arthritis Mother    Rheum arthritis Father    Rheum arthritis Maternal Grandmother    ALS Paternal Aunt    Breast cancer Paternal Aunt     Social History   Socioeconomic History   Marital status: Married    Spouse name: Not on file   Number of children: Not on file   Years of education: Not on file   Highest education level: Not on file  Occupational History   Not on file  Tobacco Use   Smoking status: Never   Smokeless tobacco: Never  Vaping Use   Vaping status: Never Used  Substance and Sexual Activity   Alcohol use: Not Currently    Comment: oc   Drug use: Never   Sexual  activity: Not Currently    Partners: Male    Birth control/protection: Post-menopausal  Other Topics Concern   Not on file  Social History Narrative   Rt handed    Lives with family   Social Drivers of Health   Financial Resource Strain: Low Risk  (09/21/2023)  Received from [facility]   Overall Financial Resource Strain (CARDIA)    Difficulty of Paying Living Expenses: Not hard at all  Food Insecurity: No Food Insecurity (09/21/2023)   Received from [facility]   Hunger Vital Sign    Worried About Running Out of Food in the Last Year: Never true    Ran Out of Food in the Last Year: Never true  Transportation Needs: No Transportation Needs (09/21/2023)   Received from [facility] - Transportation    Lack of Transportation (Medical): No    Lack of Transportation (Non-Medical): No  Physical Activity: Unknown (09/21/2023)   Received from [facility]   Exercise Vital Sign    Days of Exercise per Week: Patient declined    Minutes of Exercise per Session: 60 min  Stress: Stress Concern Present (09/21/2023)   Received from [facility] - Occupational Stress Questionnaire    Feeling of Stress : To some extent  Social Connections: Somewhat Isolated (09/21/2023)   Received from [facility]   Social Network    How would you rate your social network (family, work, friends)?: Restricted participation with some degree of social isolation  Intimate Partner Violence: Not At Risk (09/21/2023)   Received from [facility]   HITS    Over the last 12 months how often did your partner physically hurt you?: Never    Over the last 12 months how often did your partner insult you or talk down to you?: Never    Over the last 12 months how often did your partner threaten you with physical harm?: Never    Over the last 12 months how often did your partner scream or curse at you?: Never    Review of Systems  All other systems reviewed and  are negative.   PHYSICAL EXAMINATION:   BP 116/80 (BP Location: Right Arm, Patient Position: Sitting, Cuff Size: Normal)   Pulse 65   Resp 16     General appearance: alert, cooperative and appears stated age  ASSESSMENT:  Osteoporosis, age related without current fracture.  Carotid artery stenosis.  Seizure disorder.   PLAN:  We reviewed that osteoporosis is a silent disease until fracture occurs.  Fracture can be associated with increased risk of morbility and mortality. We discussed osteoporosis - risk factors, treatment options, and follow up.  Tx may include Fosamax, Prolia, Reclast. She declines prescription Rx. Calcium intake 1200 - 1500 mg daily, vit D at least 600 - 800 international units daily, and weight bearing exercise recommended. We discussed ways to reduce fall risk.   She declines blood work today for further evaluation.  She accepts referral to Dr. Valarie Cones of endocrinology at [facility].  I placed the order for the referral. Patient and her husband express appreciation for ordering the bone density which found the diagnosis of osteoporosis.  FU prn.   40 min  total time was spent for this patient encounter, including preparation, face-to-face counseling with the patient, coordination of care, and documentation of the encounter.

## 2024-03-25 ENCOUNTER — Encounter: Payer: Self-pay | Admitting: Obstetrics and Gynecology

## 2024-03-25 ENCOUNTER — Ambulatory Visit: Admitting: Obstetrics and Gynecology

## 2024-03-25 VITALS — BP 116/80 | HR 65 | Resp 16

## 2024-03-25 DIAGNOSIS — M810 Age-related osteoporosis without current pathological fracture: Secondary | ICD-10-CM

## 2024-03-25 NOTE — Patient Instructions (Signed)
 Weak, Fragile Bones (Osteoporosis): What to Know  Osteoporosis is when the bones become thin and less dense than normal. Osteoporosis makes bones more fragile and more likely to break. Over time, the condition can cause your bones to become so weak that they break, or fracture, after a minor fall. Bones in the hip, wrist, and spine are most likely to break. What are the causes? The exact cause of this condition is not known. What increases the risk? Having family members with this condition. Taking: Steroid medicines. Anti-seizure medicines. Being female. Being 64 years old or older. Being of European decent. Smoking or using other products that contain nicotine or tobacco. Not exercising. What are the signs or symptoms? A broken bone might be the first sign, especially if the break results from a fall or injury that usually would not cause a bone to break. Other signs and symptoms include: Pain in the neck or low back. Being hunched over. Getting shorter. How is this diagnosed? This condition may be diagnosed based on: Your medical history. A physical exam. A bone mineral density test, also called a DXA or DEXA test. This test uses X-rays to measure how dense your bones are. How is this treated? This condition may be treated with medicines and supplements, including: Taking medicines to slow bone loss or help make the bones stronger. Taking calcium and vitamin D supplements every day. Taking hormone replacement medicines, such as estrogen for female and testosterone for males. It may also be treated by: Quitting smoking or using tobacco products. Doing exercises. Limiting how much alcohol you drink. Eating more foods with calcium and vitamin D in them. Monitoring your blood levels of calcium and vitamin D. The goal of treatment is to strengthen your bones and lower your risk for a bone break. Follow these instructions at home: Eating and drinking Eat plenty of food with  calcium and vitamin D in them. This may include: Some fish, such as salmon and tuna. Foods that have calcium and vitamin D added to them, such as some cereals. Egg yolks. Cheese. Liver.  Activity Exercise as told. Ask what things are safe for you to do. You may be told to: Do exercises that make your muscles work to hold your body weight up, such as tai chi, yoga, or walking. These are called weight-bearing exercises. Do exercises to make your muscles stronger, such as lifting weights. These are called muscle-strengthening exercises. Lifestyle Do not drink alcohol if your health care provider tells you not to drink. Your health care provider tells you not to drink. You are pregnant, may be pregnant, or are planning to become pregnant. If you drink alcohol: Limit how much you have to: 0-1 drink a day if you're female. 0-2 drinks a day if you're female. Know how much alcohol is in your drink. In the [place] one drink is one 12 oz bottle of beer (355 mL), one 5 oz glass of wine (148 mL), or one 1 oz glass of hard liquor (44 mL). Do not smoke, vape, or use nicotine or tobacco. Preventing falls Use a cane, walker, scooter, or crutches to help you move around if needed. Keep rooms well-lit and get rid of clutter. Put away things on the floor that could make you trip, such as cords and rugs. Put grab bars in bathrooms and safety rails on stairs. Use rubber mats in slippery areas, like bathrooms. Wear shoes that: Fit you well. Support your feet. Have closed toes. Have rubber soles or low  heels. Talk to your provider about all of the medicines you take. Some medicines can make you more likely to fall because they can cause dizziness or changes in blood pressure. General instructions Take your medicines only as told. Keep all follow-up visits. Your provider may want to repeat tests. Where to find more information Bone Health and Osteoporosis Foundation: bonehealthandosteoporosis.org Contact  a health care provider if: You have never been screened for osteoporosis and you are: A female who is 16 years old or older. A female who is age 28 years old or older. Get help right away if: You fall. You get hurt. This information is not intended to replace advice given to you by your health care provider. Make sure you discuss any questions you have with your health care provider. Document Revised: 10/14/2023 Document Reviewed: 06/20/2023 Elsevier Patient Education  2024 [employer].

## 2024-05-17 ENCOUNTER — Encounter: Payer: Self-pay | Admitting: Neurology

## 2024-05-17 ENCOUNTER — Telehealth (INDEPENDENT_AMBULATORY_CARE_PROVIDER_SITE_OTHER): Payer: 59 | Admitting: Neurology

## 2024-05-17 VITALS — Ht 66.0 in | Wt 152.0 lb

## 2024-05-17 DIAGNOSIS — G40209 Localization-related (focal) (partial) symptomatic epilepsy and epileptic syndromes with complex partial seizures, not intractable, without status epilepticus: Secondary | ICD-10-CM

## 2024-05-17 MED ORDER — VIMPAT 200 MG PO TABS
200.0000 mg | ORAL_TABLET | Freq: Two times a day (BID) | ORAL | 5 refills | Status: AC
Start: 2024-05-17 — End: ?

## 2024-05-17 MED ORDER — LORAZEPAM 0.5 MG PO TABS: ORAL_TABLET | ORAL | 5 refills | Status: AC

## 2024-05-17 MED ORDER — ZONISAMIDE 100 MG PO CAPS: ORAL_CAPSULE | ORAL | 3 refills | Status: AC
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
## 2024-08-18 ENCOUNTER — Other Ambulatory Visit: Payer: Self-pay | Admitting: Neurology

## 2024-09-17 ENCOUNTER — Encounter: Payer: Self-pay | Admitting: Neurology

## 2024-09-17 ENCOUNTER — Telehealth: Admitting: Neurology

## 2024-09-17 VITALS — Ht 66.0 in | Wt 156.0 lb

## 2024-09-17 DIAGNOSIS — G40209 Localization-related (focal) (partial) symptomatic epilepsy and epileptic syndromes with complex partial seizures, not intractable, without status epilepticus: Secondary | ICD-10-CM
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
## 2024-09-17 NOTE — Telephone Encounter (Signed)
 Deborah Lynn - [birth year] 05/18/24 noon: Dj vu and then seizure. Possibly not enough sleep and stress from our move.  07/30/24 4 pm: Had dj vu and then a seizure while putting the guest room together for company. Went to bed and slept for an hour.  07/31/24 9:00 pm:  Significant dj vu and then seizure with loss of bladder control. Not sure now long it lasted. Took a lorazepam  and went to bed after showering. 08/01/24 2 pm: During a late lunch at a restaurant while listening to a conversation. Not as intense as the night before. Took a lorazepam . Went home to take a nap.  08/02/24 11:30 am and 2 pm. First seizure happened while driving around looking at houses. Not a good idea in the first place as driving around in circles too long will trigger. Took another lorazepam  when we got home. Used the magnet that we keep in the car. Ate lunch, then started laundry. Had another one when I was folding first load.  Mild dj vu first this time. 08/03/24 8:45 am and 9 pm: First one while I was watching the news. Used the magnet on the end table. Second started with intense dj vu and then was a sick, hot intense feeling episode. Took a lorazepam . 08/04/24 9ish: While watching TV.  Started with Dj vu 08/05/24 4:45pm: Dj vu, "hot" seizure while listening to V talk loudly, rapidly after she got home from work.  08/12/24 3pm: Very long seizure after creepy bad feeling Dj vu. Another trip out looking at houses.  09/09/24 8am, 9:15am, 12:15 pm and 4pm: 1) seizure right after I got out of the shower. 2) Another while watching news. Took a lorazepam .  3) Another while talking with a friend at noon. She texted Richard and said I went "dark" and was then talking gibberish. He came from his office and got my phone. 4) While reading. Took another lorazepam  and went to bed.  09/10/24 9am and 2:30: Had a minor dj vu while on the phone with Richard.  Had another minor dj vu while listening to a call Charlie was on with his  sister.  09/11/24 10am: minor dj vu while answering email on laptop.   We have magnets next to the sofa and the bed and in the car. But I do forget. If I remember when I get a "feeling" before a dj vu feeling I will use it. Mostly when we are in the car.  Setting an alarm to do it in the morning would probably be a good idea.

## 2024-09-17 NOTE — Patient Instructions (Signed)
 Good to see you. Congratulations on your new home and good luck with the move! Continue all your medications. Follow-up on October 27 at 1:30pm to check VNS battery and adjust as tolerated.    Seizure Precautions: 1. If medication has been prescribed for you to prevent seizures, take it exactly as directed.  Do not stop taking the medicine without talking to your doctor first, even if you have not had a seizure in a long time.   2. Avoid activities in which a seizure would cause danger to yourself or to others.  Don't operate dangerous machinery, swim alone, or climb in high or dangerous places, such as on ladders, roofs, or girders.  Do not drive unless your doctor says you may.  3. If you have any warning that you may have a seizure, lay down in a safe place where you can't hurt yourself.    4.  No driving for 6 months from last seizure, as per [place]  state law.   Please refer to the following link on the Epilepsy Foundation of America's website for more information: http://www.epilepsyfoundation.org/answerplace/Social/driving/drivingu.cfm   5.  Maintain good sleep hygiene. Avoid alcohol  6.  Contact your doctor if you have any problems that may be related to the medicine you are taking.  7.  Call 911 and bring the patient back to the ED if:        A.  The seizure lasts longer than 5 minutes.       B.  The patient doesn't awaken shortly after the seizure  C.  The patient has new problems such as difficulty seeing, speaking or moving  D.  The patient was injured during the seizure  E.  The patient has a temperature over 102 F (39C)  F.  The patient vomited and now is having trouble breathing

## 2024-10-11 ENCOUNTER — Encounter: Payer: Self-pay | Admitting: Neurology

## 2024-10-11 ENCOUNTER — Ambulatory Visit: Admitting: Neurology

## 2024-10-11 VITALS — BP 128/77 | HR 61 | Ht 66.0 in | Wt 162.2 lb

## 2024-10-11 DIAGNOSIS — G40209 Localization-related (focal) (partial) symptomatic epilepsy and epileptic syndromes with complex partial seizures, not intractable, without status epilepticus: Secondary | ICD-10-CM | POA: Diagnosis not present

## 2024-10-11 DIAGNOSIS — M542 Cervicalgia: Secondary | ICD-10-CM | POA: Diagnosis not present

## 2024-10-11 MED ORDER — CLONAZEPAM 0.5 MG PO TABS: ORAL_TABLET | ORAL | 5 refills | Status: AC
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
## 2024-10-11 NOTE — Patient Instructions (Signed)
 It's always good to see you. Continue all your medications. Try the stretching exercises, as needed Ibuprofen, ice the area. Follow-up in 3-4 months, call for any changes   Seizure Precautions: 1. If medication has been prescribed for you to prevent seizures, take it exactly as directed.  Do not stop taking the medicine without talking to your doctor first, even if you have not had a seizure in a long time.   2. Avoid activities in which a seizure would cause danger to yourself or to others.  Don't operate dangerous machinery, swim alone, or climb in high or dangerous places, such as on ladders, roofs, or girders.  Do not drive unless your doctor says you may.  3. If you have any warning that you may have a seizure, lay down in a safe place where you can't hurt yourself.    4.  No driving for 6 months from last seizure, as per [place]  state law.   Please refer to the following link on the Epilepsy Foundation of America's website for more information: http://www.epilepsyfoundation.org/answerplace/Social/driving/drivingu.cfm   5.  Maintain good sleep hygiene. Avoid alcohol  6.  Contact your doctor if you have any problems that may be related to the medicine you are taking.  7.  Call 911 and bring the patient back to the ED if:        A.  The seizure lasts longer than 5 minutes.       B.  The patient doesn't awaken shortly after the seizure  C.  The patient has new problems such as difficulty seeing, speaking or moving  D.  The patient was injured during the seizure  E.  The patient has a temperature over 102 F (39C)  F.  The patient vomited and now is having trouble breathing

## 2024-11-09 ENCOUNTER — Telehealth: Payer: Self-pay | Admitting: Neurology

## 2024-11-09 DIAGNOSIS — G40209 Localization-related (focal) (partial) symptomatic epilepsy and epileptic syndromes with complex partial seizures, not intractable, without status epilepticus: Secondary | ICD-10-CM

## 2024-11-09 NOTE — Telephone Encounter (Signed)
 Pt called she was informed that Dr Georjean is out  of the office she  is ready to have referral sent to [facility] for surgery. Pt was transferred to the front to get scheduled for an appointment , She was advised that when Dr Georjean returns I will ask her about getting that referral sent ,

## 2024-11-09 NOTE — Telephone Encounter (Signed)
 Pt wants to talk with aquino about making the appt for brain surgery. She said they have talked about it. She can do a VV if she needs to.

## 2024-11-15 NOTE — Telephone Encounter (Signed)
 Pls send referral to [facility] for presurgical evaluation. Thanks!  [phone] Fax: [phone]

## 2024-11-16 NOTE — Telephone Encounter (Signed)
 Pt called informed I have faxed her referral to the [facility] for her

## 2024-11-25 ENCOUNTER — Encounter: Payer: Self-pay | Admitting: Neurology

## 2024-12-01 ENCOUNTER — Encounter: Payer: Self-pay | Admitting: Neurology

## 2024-12-01 ENCOUNTER — Other Ambulatory Visit: Payer: Self-pay | Admitting: Neurology

## 2024-12-01 MED ORDER — VIMPAT 200 MG PO TABS: 200.0000 mg | ORAL_TABLET | Freq: Two times a day (BID) | ORAL | 5 refills | Status: AC

## 2024-12-01 NOTE — Telephone Encounter (Signed)
 Burnard, Pharmacist: [facility]   [facility]: [phone]  Kelly lvm regarding Vimpat  200 mg. Calling to get doctors approval. She stated that Rx was shipped out last week, and [facility] still has not received. She wanted to get permission to send  it out again. So pt has it in time for vacation. Open M-F from 9 am - 5 pm

## 2024-12-02 NOTE — Telephone Encounter (Signed)
 Pharmacy called they received new RX they will send it out

## 2025-02-23 ENCOUNTER — Ambulatory Visit: Admitting: Neurology
